# Patient Record
Sex: Female | Born: 1935 | Race: White | Hispanic: No | Smoking: Never smoker
Health system: Southern US, Community
[De-identification: ages and names within clinical notes are randomized; demographics above are authoritative.]

## PROBLEM LIST (undated history)

## (undated) DIAGNOSIS — I10 Essential (primary) hypertension: Secondary | ICD-10-CM

## (undated) DIAGNOSIS — F0390 Unspecified dementia without behavioral disturbance: Secondary | ICD-10-CM

## (undated) DIAGNOSIS — IMO0001 Reserved for inherently not codable concepts without codable children: Secondary | ICD-10-CM

## (undated) DIAGNOSIS — M109 Gout, unspecified: Secondary | ICD-10-CM

## (undated) DIAGNOSIS — R42 Dizziness and giddiness: Secondary | ICD-10-CM

## (undated) DIAGNOSIS — I4891 Unspecified atrial fibrillation: Secondary | ICD-10-CM

## (undated) DIAGNOSIS — C50919 Malignant neoplasm of unspecified site of unspecified female breast: Secondary | ICD-10-CM

## (undated) DIAGNOSIS — M1990 Unspecified osteoarthritis, unspecified site: Secondary | ICD-10-CM

## (undated) DIAGNOSIS — H9190 Unspecified hearing loss, unspecified ear: Secondary | ICD-10-CM

## (undated) DIAGNOSIS — F419 Anxiety disorder, unspecified: Secondary | ICD-10-CM

## (undated) HISTORY — PX: CHOLECYSTECTOMY: SHX55

---

## 2005-08-14 ENCOUNTER — Ambulatory Visit: Payer: Self-pay | Admitting: Internal Medicine

## 2006-08-22 ENCOUNTER — Ambulatory Visit: Payer: Self-pay | Admitting: Internal Medicine

## 2006-09-03 ENCOUNTER — Ambulatory Visit: Payer: Self-pay | Admitting: Internal Medicine

## 2007-02-25 ENCOUNTER — Ambulatory Visit: Payer: Self-pay | Admitting: Internal Medicine

## 2007-04-12 ENCOUNTER — Emergency Department: Payer: Self-pay

## 2007-09-07 ENCOUNTER — Ambulatory Visit: Payer: Self-pay | Admitting: Internal Medicine

## 2008-09-13 ENCOUNTER — Ambulatory Visit: Payer: Self-pay | Admitting: Internal Medicine

## 2009-10-06 ENCOUNTER — Ambulatory Visit: Payer: Self-pay | Admitting: Internal Medicine

## 2010-01-04 ENCOUNTER — Ambulatory Visit: Payer: Self-pay | Admitting: Unknown Physician Specialty

## 2010-12-12 ENCOUNTER — Ambulatory Visit: Payer: Self-pay | Admitting: Internal Medicine

## 2011-03-16 ENCOUNTER — Emergency Department: Payer: Self-pay | Admitting: Internal Medicine

## 2011-05-16 ENCOUNTER — Ambulatory Visit: Payer: Self-pay | Admitting: Internal Medicine

## 2011-11-01 ENCOUNTER — Emergency Department: Payer: Self-pay | Admitting: *Deleted

## 2011-12-03 HISTORY — PX: BREAST LUMPECTOMY: SHX2

## 2011-12-10 ENCOUNTER — Encounter: Payer: Self-pay | Admitting: Internal Medicine

## 2012-01-03 ENCOUNTER — Encounter: Payer: Self-pay | Admitting: Internal Medicine

## 2012-01-31 ENCOUNTER — Encounter: Payer: Self-pay | Admitting: Internal Medicine

## 2012-03-02 ENCOUNTER — Ambulatory Visit: Payer: Self-pay | Admitting: Internal Medicine

## 2012-03-03 ENCOUNTER — Ambulatory Visit: Payer: Self-pay | Admitting: Internal Medicine

## 2012-03-30 ENCOUNTER — Ambulatory Visit: Payer: Self-pay | Admitting: Surgery

## 2012-04-03 LAB — PATHOLOGY REPORT

## 2012-04-16 ENCOUNTER — Ambulatory Visit: Payer: Self-pay | Admitting: Surgery

## 2012-04-16 LAB — BASIC METABOLIC PANEL
Anion Gap: 7 (ref 7–16)
Chloride: 97 mmol/L — ABNORMAL LOW (ref 98–107)
EGFR (African American): 36 — ABNORMAL LOW
Glucose: 99 mg/dL (ref 65–99)
Osmolality: 267 (ref 275–301)
Sodium: 132 mmol/L — ABNORMAL LOW (ref 136–145)

## 2012-04-16 LAB — CBC WITH DIFFERENTIAL/PLATELET
Basophil #: 0.1 10*3/uL (ref 0.0–0.1)
Eosinophil %: 0.7 %
HCT: 34.1 % — ABNORMAL LOW (ref 35.0–47.0)
Lymphocyte #: 2 10*3/uL (ref 1.0–3.6)
Lymphocyte %: 20.6 %
MCH: 30.6 pg (ref 26.0–34.0)
MCHC: 32.8 g/dL (ref 32.0–36.0)
Monocyte #: 0.6 x10 3/mm (ref 0.2–0.9)
Monocyte %: 6.2 %
Neutrophil #: 7 10*3/uL — ABNORMAL HIGH (ref 1.4–6.5)
Neutrophil %: 71.9 %
Platelet: 205 10*3/uL (ref 150–440)
RDW: 14.4 % (ref 11.5–14.5)

## 2012-04-23 ENCOUNTER — Ambulatory Visit: Payer: Self-pay | Admitting: Surgery

## 2012-05-07 ENCOUNTER — Ambulatory Visit: Payer: Self-pay | Admitting: Oncology

## 2012-06-01 ENCOUNTER — Ambulatory Visit: Payer: Self-pay | Admitting: Oncology

## 2012-06-08 ENCOUNTER — Ambulatory Visit: Payer: Self-pay | Admitting: Surgery

## 2012-07-02 ENCOUNTER — Ambulatory Visit: Payer: Self-pay | Admitting: Oncology

## 2012-08-02 ENCOUNTER — Ambulatory Visit: Payer: Self-pay | Admitting: Oncology

## 2012-09-01 ENCOUNTER — Ambulatory Visit: Payer: Self-pay | Admitting: Oncology

## 2012-12-02 ENCOUNTER — Ambulatory Visit: Payer: Self-pay | Admitting: Oncology

## 2012-12-18 ENCOUNTER — Emergency Department: Payer: Self-pay | Admitting: Emergency Medicine

## 2012-12-18 LAB — URINALYSIS, COMPLETE
Bacteria: NONE SEEN
Blood: NEGATIVE
Ketone: NEGATIVE
Nitrite: NEGATIVE
Ph: 6 (ref 4.5–8.0)
Protein: 30
Specific Gravity: 1.016 (ref 1.003–1.030)
Squamous Epithelial: 2
WBC UR: 7 /HPF (ref 0–5)

## 2012-12-18 LAB — CBC
HGB: 12 g/dL (ref 12.0–16.0)
MCH: 31 pg (ref 26.0–34.0)
MCV: 93 fL (ref 80–100)
Platelet: 158 10*3/uL (ref 150–440)
RBC: 3.88 10*6/uL (ref 3.80–5.20)
RDW: 14.6 % — ABNORMAL HIGH (ref 11.5–14.5)
WBC: 10.7 10*3/uL (ref 3.6–11.0)

## 2012-12-18 LAB — COMPREHENSIVE METABOLIC PANEL
Albumin: 3.7 g/dL (ref 3.4–5.0)
Alkaline Phosphatase: 97 U/L (ref 50–136)
Anion Gap: 8 (ref 7–16)
BUN: 17 mg/dL (ref 7–18)
Bilirubin,Total: 0.5 mg/dL (ref 0.2–1.0)
Calcium, Total: 9.6 mg/dL (ref 8.5–10.1)
Chloride: 102 mmol/L (ref 98–107)
Co2: 29 mmol/L (ref 21–32)
Creatinine: 1.42 mg/dL — ABNORMAL HIGH (ref 0.60–1.30)
Glucose: 159 mg/dL — ABNORMAL HIGH (ref 65–99)
Osmolality: 282 (ref 275–301)
SGPT (ALT): 25 U/L (ref 12–78)
Sodium: 139 mmol/L (ref 136–145)
Total Protein: 7.3 g/dL (ref 6.4–8.2)

## 2012-12-18 LAB — CK TOTAL AND CKMB (NOT AT ARMC): CK-MB: 1.3 ng/mL (ref 0.5–3.6)

## 2013-01-02 ENCOUNTER — Ambulatory Visit: Payer: Self-pay | Admitting: Oncology

## 2013-02-16 ENCOUNTER — Ambulatory Visit: Payer: Self-pay | Admitting: Internal Medicine

## 2013-03-03 ENCOUNTER — Ambulatory Visit: Payer: Self-pay | Admitting: Radiation Oncology

## 2013-03-16 ENCOUNTER — Ambulatory Visit: Payer: Self-pay | Admitting: Oncology

## 2013-03-18 LAB — CBC CANCER CENTER
Basophil %: 0.6 %
Eosinophil %: 1.4 %
HCT: 32.4 % — ABNORMAL LOW (ref 35.0–47.0)
HGB: 10.4 g/dL — ABNORMAL LOW (ref 12.0–16.0)
MCH: 29 pg (ref 26.0–34.0)
MCV: 91 fL (ref 80–100)
Monocyte #: 0.5 x10 3/mm (ref 0.2–0.9)
Monocyte %: 6.1 %
Neutrophil #: 6 x10 3/mm (ref 1.4–6.5)
Neutrophil %: 72.2 %
Platelet: 186 x10 3/mm (ref 150–440)
RBC: 3.58 10*6/uL — ABNORMAL LOW (ref 3.80–5.20)

## 2013-03-18 LAB — COMPREHENSIVE METABOLIC PANEL
Albumin: 3.3 g/dL — ABNORMAL LOW (ref 3.4–5.0)
Alkaline Phosphatase: 109 U/L (ref 50–136)
Anion Gap: 5 — ABNORMAL LOW (ref 7–16)
BUN: 22 mg/dL — ABNORMAL HIGH (ref 7–18)
Calcium, Total: 9.6 mg/dL (ref 8.5–10.1)
Co2: 33 mmol/L — ABNORMAL HIGH (ref 21–32)
Creatinine: 1.53 mg/dL — ABNORMAL HIGH (ref 0.60–1.30)
EGFR (African American): 37 — ABNORMAL LOW
Osmolality: 283 (ref 275–301)
Potassium: 4.5 mmol/L (ref 3.5–5.1)
SGOT(AST): 26 U/L (ref 15–37)
Sodium: 139 mmol/L (ref 136–145)
Total Protein: 7 g/dL (ref 6.4–8.2)

## 2013-04-01 ENCOUNTER — Ambulatory Visit: Payer: Self-pay | Admitting: Oncology

## 2013-06-16 ENCOUNTER — Ambulatory Visit: Payer: Self-pay | Admitting: Oncology

## 2013-07-02 ENCOUNTER — Ambulatory Visit: Payer: Self-pay | Admitting: Oncology

## 2013-09-06 ENCOUNTER — Ambulatory Visit: Payer: Self-pay | Admitting: Oncology

## 2013-09-15 ENCOUNTER — Ambulatory Visit: Payer: Self-pay | Admitting: Oncology

## 2013-09-15 LAB — CBC CANCER CENTER
Basophil %: 0.4 %
Eosinophil #: 0.2 x10 3/mm (ref 0.0–0.7)
Eosinophil %: 2.5 %
HCT: 31.1 % — ABNORMAL LOW (ref 35.0–47.0)
HGB: 10.2 g/dL — ABNORMAL LOW (ref 12.0–16.0)
Lymphocyte %: 18.2 %
MCH: 30.2 pg (ref 26.0–34.0)
MCHC: 32.7 g/dL (ref 32.0–36.0)
MCV: 92 fL (ref 80–100)
Monocyte #: 0.7 x10 3/mm (ref 0.2–0.9)
Monocyte %: 9.1 %
Neutrophil #: 5.5 x10 3/mm (ref 1.4–6.5)
Neutrophil %: 69.8 %
RBC: 3.36 10*6/uL — ABNORMAL LOW (ref 3.80–5.20)
RDW: 14.6 % — ABNORMAL HIGH (ref 11.5–14.5)

## 2013-09-15 LAB — COMPREHENSIVE METABOLIC PANEL
Albumin: 3.4 g/dL (ref 3.4–5.0)
Anion Gap: 9 (ref 7–16)
BUN: 26 mg/dL — ABNORMAL HIGH (ref 7–18)
Calcium, Total: 9.2 mg/dL (ref 8.5–10.1)
Chloride: 98 mmol/L (ref 98–107)
Co2: 30 mmol/L (ref 21–32)
Creatinine: 1.67 mg/dL — ABNORMAL HIGH (ref 0.60–1.30)
EGFR (African American): 33 — ABNORMAL LOW
EGFR (Non-African Amer.): 28 — ABNORMAL LOW
Glucose: 129 mg/dL — ABNORMAL HIGH (ref 65–99)
Osmolality: 280 (ref 275–301)
SGOT(AST): 92 U/L — ABNORMAL HIGH (ref 15–37)
SGPT (ALT): 83 U/L — ABNORMAL HIGH (ref 12–78)
Total Protein: 6.9 g/dL (ref 6.4–8.2)

## 2013-09-16 LAB — CANCER ANTIGEN 27.29: CA 27.29: 33 U/mL (ref 0.0–38.6)

## 2013-10-02 ENCOUNTER — Ambulatory Visit: Payer: Self-pay | Admitting: Oncology

## 2014-03-08 DIAGNOSIS — M545 Low back pain, unspecified: Secondary | ICD-10-CM | POA: Insufficient documentation

## 2014-03-08 DIAGNOSIS — E785 Hyperlipidemia, unspecified: Secondary | ICD-10-CM | POA: Insufficient documentation

## 2014-03-08 DIAGNOSIS — I10 Essential (primary) hypertension: Secondary | ICD-10-CM | POA: Insufficient documentation

## 2014-03-08 DIAGNOSIS — E119 Type 2 diabetes mellitus without complications: Secondary | ICD-10-CM | POA: Insufficient documentation

## 2014-03-09 ENCOUNTER — Ambulatory Visit: Payer: Self-pay | Admitting: Oncology

## 2014-06-09 DIAGNOSIS — F0390 Unspecified dementia without behavioral disturbance: Secondary | ICD-10-CM | POA: Insufficient documentation

## 2014-06-16 ENCOUNTER — Ambulatory Visit: Payer: Self-pay | Admitting: Oncology

## 2014-09-19 ENCOUNTER — Ambulatory Visit: Payer: Self-pay | Admitting: Oncology

## 2014-09-19 LAB — COMPREHENSIVE METABOLIC PANEL
ALBUMIN: 3.8 g/dL (ref 3.4–5.0)
ALT: 20 U/L
ANION GAP: 7 (ref 7–16)
AST: 21 U/L (ref 15–37)
Alkaline Phosphatase: 160 U/L — ABNORMAL HIGH
BILIRUBIN TOTAL: 0.5 mg/dL (ref 0.2–1.0)
BUN: 14 mg/dL (ref 7–18)
CALCIUM: 9.5 mg/dL (ref 8.5–10.1)
CREATININE: 1.22 mg/dL (ref 0.60–1.30)
Chloride: 97 mmol/L — ABNORMAL LOW (ref 98–107)
Co2: 31 mmol/L (ref 21–32)
EGFR (African American): 54 — ABNORMAL LOW
GFR CALC NON AF AMER: 45 — AB
Glucose: 111 mg/dL — ABNORMAL HIGH (ref 65–99)
OSMOLALITY: 271 (ref 275–301)
Potassium: 4.1 mmol/L (ref 3.5–5.1)
Sodium: 135 mmol/L — ABNORMAL LOW (ref 136–145)
Total Protein: 7.3 g/dL (ref 6.4–8.2)

## 2014-09-19 LAB — CBC CANCER CENTER
BASOS ABS: 0 x10 3/mm (ref 0.0–0.1)
BASOS PCT: 0.5 %
EOS ABS: 0.1 x10 3/mm (ref 0.0–0.7)
EOS PCT: 0.8 %
HCT: 35.9 % (ref 35.0–47.0)
HGB: 11.1 g/dL — ABNORMAL LOW (ref 12.0–16.0)
LYMPHS ABS: 1.6 x10 3/mm (ref 1.0–3.6)
LYMPHS PCT: 21.9 %
MCH: 28.7 pg (ref 26.0–34.0)
MCHC: 31 g/dL — ABNORMAL LOW (ref 32.0–36.0)
MCV: 93 fL (ref 80–100)
MONO ABS: 0.5 x10 3/mm (ref 0.2–0.9)
Monocyte %: 7.3 %
Neutrophil #: 5.1 x10 3/mm (ref 1.4–6.5)
Neutrophil %: 69.5 %
PLATELETS: 170 x10 3/mm (ref 150–440)
RBC: 3.88 10*6/uL (ref 3.80–5.20)
RDW: 14.3 % (ref 11.5–14.5)
WBC: 7.4 x10 3/mm (ref 3.6–11.0)

## 2014-10-02 ENCOUNTER — Ambulatory Visit: Payer: Self-pay | Admitting: Oncology

## 2015-03-23 ENCOUNTER — Ambulatory Visit
Admit: 2015-03-23 | Disposition: A | Discharge status: Home / Self Care | Payer: Self-pay | Attending: Oncology | Admitting: Oncology

## 2015-03-26 NOTE — Consult Note (Signed)
Reason for Visit: This 81 year old Female patient presents to the clinic for initial evaluation of  Breast cancer .   Referred by Dr. Oliva Bustard.  Diagnosis:   Chief Complaint/Diagnosis   81 year old female status post wide local excision and sentinel node biopsy for a pathologic stage I (T1 A. N0 M0) ER/PR positive HER-2/neu negative invasive mammary carcinoma   Pathology Report Pathology report reviewed    Imaging Report Mammograms reviewed    Referral Report Clinical notes reviewed    Planned Treatment Regimen Possible accelerated partial breast irradiation versus whole breast radiation    HPI   patient is a pleasant 81 year old female who presents with an abnormal mammogram in the 12 o'clock position of the left breast. This was confirmed on ultrasound. Stereotactic biopsy was performed showing invasive mammary carcinoma no specific type.was ductal carcinoma in situ present. Patient on a wide local excision for a 2 mm invasive mammary carcinoma margins clear. One sentinel lymph node was negative. She tolerated her surgery well although did develop some bruising which is persistent. She has been evaluated by medical oncology will be placed on aromatase inhibitor after completion of radiation therapy. She otherwise is doing well.  Past Hx:    indigestion:    dementia:    gout:    a fib:    lumbar disc disease:    arthritis:    diet controlled diabetes:    hx of thyroid:    breast cancer:    perennial allergic rhinitis:    leg tremors:    dizziness:    htn:    macular degeneration:    Mastectomy:    back cyst removed:    Tonsillectomy:    Cholecystectomy:   Past, Family and Social History:   Past Medical History positive    Cardiovascular atrial fibrillation; hypertension    Gastrointestinal GERD    Endocrine diabetes mellitus    Neurological/Psychiatric Alzheimer's    Past Surgical History cholecystectomy; Tonsillectomy    Past Medical History  Comments Gallop, arthritis, leg tremors, macular degeneration, recurrent vertigo, obesity, lumbar disc disease    Family History positive    Family History Comments Sister with breast cancer former patient of mine also family history of hypertension    Social History noncontributory    Additional Past Medical and Surgical History Accompanied by his son and daughter today   Allergies:   PCN: Itching, Rash  Home Meds:  Home Medications: Medication Instructions Status  Caltrate 600 with D daily 1 tab(s) orally once a day (at bedtime) Active  norvasc  102m daily 1 tab(s) orally once a day at night Active  allopurinol 3034mdaily 1 tab(s) orally once a day in am Active  citalopram 2038m tabs  orally once a day in am Active  PreserVision 2 tabs   orally once a day in am Active  Vitamin B-100 oral tablet 1 tab(s) orally once a day in am Active  lisinopril 20 mg oral tablet 1 tab(s) orally 2 times a day Active   Review of Systems:   General negative    Performance Status (ECOG) 0    Skin negative    Breast see HPI    Ophthalmologic see HPI    ENMT negative    Respiratory and Thorax negative    Cardiovascular see HPI    Gastrointestinal see HPI    Genitourinary negative    Musculoskeletal negative    Neurological negative    Psychiatric see HPI    Endocrine negative  Allergic/Immunologic negative   Nursing Notes:  Nursing Vital Signs and Chemo Nursing Nursing Notes: *CC Vital Signs Flowsheet:   06-Jun-13 13:38   Temp Temperature 97.9   Pulse Pulse 111   Respirations Respirations 18   SBP SBP 152   DBP DBP 82   Pain Scale (0-10)  0   Current Weight (kg) (kg) 105   Height (cm) centimeters 163   BSA (m2) 2    14:45   Pain Scale (0-10)  0   Height (cm) centimeters 163   Physical Exam:  General/Skin/HEENT:   General normal    Skin normal    Eyes normal    ENMT normal    Head and Neck normal    Additional PE Well-developed elderly female in  NAD. Left breast has a lot wide local excision scar which is healing well. There some ecchymosis surrounding the incision site. Axillary incision is also healing well. No other dominant mass or nodularity is noted in either breast into position examined. Lungs are clear to A&P cardiac examination shows regular rate and rhythm. No axillary or supraclavicular adenopathy is appreciated.   Breasts/Resp/CV/GI/GU:   Respiratory and Thorax normal    Cardiovascular normal    Gastrointestinal normal    Genitourinary normal   MS/Neuro/Psych/Lymph:   Musculoskeletal normal    Neurological normal    Lymphatics normal   Assessment and Plan:  Impression:   stage I invasive mammary carcinoma of the left breast status post wide local excision and sentinel biopsy and 81 year old female  Plan:   at this time based on extreme small nature of her lesion as well as age believe she would be a good candidate for accelerated partial breast irradiation. Have discussed the case with Dr. Thompson Caul office. He will be performing ultrasound on Monday to determine if lumpectomy pocket is still present. If MammoSite catheter could not be placed we will alter with whole breast radiation. Risks and benefits of radiation were discussed with the patient and her family. Side effects including redness of the skin possible some slight lung radiation as well as fatigue were all discussed in depth with the patient and her family. We will make further determinations as to treatment option after her ultrasound on Monday.  I would like to take this opportunity to thank you for allowing me to continue to participate in this patient's care.  CC Referral:   cc: Dr. Tamala Julian, Dr. Arline Asp   Electronic Signatures: Baruch Gouty, Roda Shutters (MD)  (Signed 06-Jun-13 15:36)  Authored: HPI, Diagnosis, Past Hx, PFSH, Allergies, Home Meds, ROS, Nursing Notes, Physical Exam, Encounter Assessment and Plan, CC Referring Physician   Last Updated:  06-Jun-13 15:36 by Armstead Peaks (MD)

## 2015-03-26 NOTE — Op Note (Signed)
PATIENT NAME:  Marie Hicks, Marie Hicks MR#:  [id number] DATE OF BIRTH:  [birth year]  DATE OF PROCEDURE:  04/23/2012  PREOPERATIVE DIAGNOSIS: Carcinoma of the left breast.   POSTOPERATIVE DIAGNOSIS: Carcinoma of the left breast.   PROCEDURE: Left partial mastectomy with axillary sentinel lymph node biopsy.   SURGEON: Loreli Dollar, MD   ANESTHESIA: General.   INDICATIONS: This 81 year old female had recent mammogram depicting a mass in the upper aspect of the breast. She had a stereotactic biopsy which demonstrated invasive mammary carcinoma, no special type, and surgery was recommended for definitive treatment.   DESCRIPTION OF PROCEDURE: The patient was brought into the hospital to the Nuclear Medicine Department and had injection of radioactive technetium sulfur colloid which demonstrated a lymph node in the left axilla. She also had insertion of a Kopans wire with follow-up mammogram which I have reviewed the images demonstrating the biopsy marker site adjacent to the wire in the upper aspect of the left breast.   The patient was placed on the operating table in the supine position under general anesthesia. The left arm was placed on a lateral arm rest. The left breast, axilla, upper arm, and chest wall were prepared with ChloraPrep and draped in a sterile manner. The Kopans wire having been cut was in the approximately 11:30 position upper aspect of the left breast.   The axilla was probed with the gamma counter demonstrating the location of radioactivity. An approximately 4 cm incision was made in the inferior aspect of the axilla and carried down through subcutaneous tissues. One bleeding point was suture ligated with 4-0 chromic. Several small bleeding points were cauterized. Dissection was carried down through the superficial fascia down deep into the axilla using the gamma counter for direction. There was a lymph node adjacent to the rib cage which was radioactive and was removed along with a small  amount of surrounding fatty tissue. This dissection was largely carried out with electrocautery for hemostasis. The ex vivo count was greater than 800 counts per second. This was submitted for frozen section. The background count was negligible and the area was palpated. There was no grossly palpable lymph node. Hemostasis was intact.   Next, attention was turned to do the left partial mastectomy. A transversely oriented curvilinear incision was made both above and below the entrance point of the wire and the ellipse was approximately 1 cm wide. The incision extended from approximately the 11 o'clock to 1 o'clock position and was some 5 cm cephalad to the border of the areola. This was carried down through subcutaneous tissues and the dissection was made wider as it progressed down into the breast removing a sample of tissue which was approximately 5 x 5 x 5 cm in dimension and was labeled with the margin maps sutured to the specimen marking superior, inferior, medial, lateral, and deep margins. The 1 o'clock end of the skin ellipse was tagged with a nylon stitch. This was submitted for specimen mammogram and pathology. The specimen mammogram came up to the operating room and I could see the biopsy marker within the specimen. This was sent on to pathology.   The wound was inspected. Several small bleeding points were cauterized.   Pathologist called back initially to report that the sentinel lymph node appeared to be negative for metastasis and subsequently the axillary wound was further inspected. Hemostasis was intact. The wound was closed with a running 5-0 Monocryl subcuticular suture.   Next, the partial mastectomy wound was further inspected. Several small  bleeding points were cauterized. Hemostasis was intact. The subcutaneous tissues were infiltrated with 0.5% Sensorcaine with epinephrine. The subcutaneous tissues were closed with 4-0 chromic and then the wound was closed with a running 5-0 Monocryl  subcuticular suture. Both wounds were treated with Dermabond.   The patient was kept asleep and maintained a sterile field until the pathologist called back to say that the margins appeared to be good. Subsequently, the patient was prepared for transfer to the recovery room.   ____________________________ Lenna Sciara. Rochel Brome, MD [email or web address] D: 04/23/2012 13:28:21 ET T: 04/23/2012 15:53:17 ET JOB#: [id number]  cc: Loreli Dollar, MD, <Dictator> Loreli Dollar MD ELECTRONICALLY SIGNED 04/27/2012 11:14

## 2015-03-26 NOTE — Op Note (Signed)
PATIENT NAME:  Marie Hicks, Marie Hicks MR#:  [id number] DATE OF BIRTH:  09-03-1929  DATE OF PROCEDURE:  06/08/2012  PREOPERATIVE DIAGNOSIS: Carcinoma of the left breast.   POSTOPERATIVE DIAGNOSIS: Carcinoma of the left breast.  PROCEDURE: Insertion of MammoSite catheter in the left breast.   SURGEON: Rochel Brome, MD  ANESTHESIA: Local 0.5% Sensorcaine with epinephrine, monitored anesthesia care.   INDICATIONS: This 81 year old female recently had a left partial mastectomy for cancer, now needing insertion of this catheter for radiation brachytherapy.  DESCRIPTION OF PROCEDURE:  The patient was placed on the operating table in the supine position. The left arm was placed on a lateral arm rest. She was sedated. The left breast was prepared with ChloraPrep and draped in a sterile manner.   The scar from her previous partial mastectomy was in the upper aspect of the left breast. This was examined with ultrasound demonstrating a seroma which was approximately 2.5 x 2.5 x 3.5 cm in dimension. It appeared that the subcutaneous tissue between the skin and the seroma  was approximately 12 mm. Next, the skin lateral to the incision was infiltrated with 0.5% Sensorcaine with epinephrine. Using ultrasound guidance, a trocar was advanced into the seroma and the trocar was removed seeing serous fluid escape through the cannula. This was further expressed. The cannula was removed. The seroma was explored with Claiborne Billings clamps. Some additional seroma was evacuated. Next, the MammoSite catheter was inserted and inflated the balloon with 35 mL of water, watching the inflation with the ultrasound. By this point it appeared that the smallest measurement of subcutaneous tissue was approximately 7 mm and therefore did not inflate beyond 35 mL. Next, the catheter was attached to the skin with a 3-0 nylon suture. Dressings were applied using benzoin and Tegaderm. The metal stylette was exchanged for a plastic stylette and the catheter was  affixed to the skin with silk tape.     The patient tolerated the procedure satisfactorily and was then prepared for transfer to the recovery room.    ____________________________ Lenna Sciara. Rochel Brome, MD [email or web address]:bjt D: 06/08/2012 10:19:20 ET T: 06/08/2012 13:22:57 ET JOB#: [id number]  cc: Loreli Dollar, MD, <Dictator> Loreli Dollar MD ELECTRONICALLY SIGNED 06/12/2012 18:44

## 2015-06-22 ENCOUNTER — Encounter: Payer: Self-pay | Admitting: Radiation Oncology

## 2015-06-22 ENCOUNTER — Ambulatory Visit
Admission: RE | Admit: 2015-06-22 | Discharge: 2015-06-22 | Disposition: A | Discharge status: Home / Self Care | Payer: Medicare Other | Source: Ambulatory Visit | Attending: Radiation Oncology | Admitting: Radiation Oncology

## 2015-06-22 VITALS — BP 185/106 | HR 92 | Temp 97.3°F | Resp 20 | Wt 209.3 lb

## 2015-06-22 DIAGNOSIS — C50912 Malignant neoplasm of unspecified site of left female breast: Secondary | ICD-10-CM

## 2015-06-22 HISTORY — DX: Unspecified dementia, unspecified severity, without behavioral disturbance, psychotic disturbance, mood disturbance, and anxiety: F03.90

## 2015-06-22 HISTORY — DX: Gout, unspecified: M10.9

## 2015-06-22 HISTORY — DX: Unspecified osteoarthritis, unspecified site: M19.90

## 2015-06-22 HISTORY — DX: Unspecified atrial fibrillation: I48.91

## 2015-06-22 HISTORY — DX: Essential (primary) hypertension: I10

## 2015-06-22 HISTORY — DX: Malignant neoplasm of unspecified site of unspecified female breast: C50.919

## 2015-06-22 NOTE — Progress Notes (Signed)
Radiation Oncology Follow up Note  Name: Marie Hicks   Date:   06/22/2015 MRN:  [id number] DOB: October 05, 1929    This 81 y.o. female presents to the clinic today for follow-up for breast cancer stage I (T1 1 N0 M0) ER/PR positive HER-2/neu negative status post accelerated partial breast radiation 3 years prior  REFERRING PROVIDER: No ref. provider found  HPI: patient is a 81 year old female now out 3 years having completed accelerated partial breast radiation to the left breast for stage I (T1 N0 M0) ER/PR positive HER-2/neu negative invasive mammary carcinoma. Seen today in routine follow-up she is doing well. Seems to be slowly debilitating. She specifically denies breast tenderness cough or bone pain. Is currently on aromatase inhibitor and tolerated that well without side effect. Mammograms have been fine.  COMPLICATIONS OF TREATMENT: none  FOLLOW UP COMPLIANCE: keeps appointments   PHYSICAL EXAM:  BP 185/106 mmHg  Pulse 92  Temp(Src) 97.3 F (36.3 C)  Resp 20  Wt 209 lb 5.2 oz (94.95 kg) Well-developed elderly female in NADLungs are clear to A&P cardiac examination essentially unremarkable with regular rate and rhythm. No dominant mass or nodularity is noted in either breast in 2 positions examined. Incision is well-healed. No axillary or supraclavicular adenopathy is appreciated. Cosmetic result is excellent.she does have some residual glandular Caddick changes of the skin over the balloon catheter site although it is well-healed.  Well-developed well-nourished patient in NAD. HEENT reveals PERLA, EOMI, discs not visualized.  Oral cavity is clear. No oral mucosal lesions are identified. Neck is clear without evidence of cervical or supraclavicular adenopathy. Lungs are clear to A&P. Cardiac examination is essentially unremarkable with regular rate and rhythm without murmur rub or thrill. Abdomen is benign with no organomegaly or masses noted. Motor sensory and DTR levels are equal and  symmetric in the upper and lower extremities. Cranial nerves II through XII are grossly intact. Proprioception is intact. No peripheral adenopathy or edema is identified. No motor or sensory levels are noted. Crude visual fields are within normal range.   RADIOLOGY RESULTS: mammograms from April 2016 are reviewed. Shows some stable calcifications in the right breast with follow-up bilateral diagnostic mammogram suggested in one year. That film was reviewed  PLAN: present time she continues to do well. Based on her advanced age and overall general condition I'm to discontinue follow-up care at this point. She continues close follow-up care with medical oncology. Be happy to reevaluate the patient any time should further consultation be indicated.  I would like to take this opportunity for allowing me to participate in the care of your patient.Armstead Peaks., MD

## 2015-09-05 ENCOUNTER — Encounter: Payer: Self-pay | Admitting: *Deleted

## 2015-09-08 NOTE — Discharge Instructions (Signed)

## 2015-09-11 ENCOUNTER — Encounter: Payer: Self-pay | Admitting: Anesthesiology

## 2015-09-11 ENCOUNTER — Ambulatory Visit: Payer: Medicare Other | Admitting: Anesthesiology

## 2015-09-11 ENCOUNTER — Ambulatory Visit
Admission: RE | Admit: 2015-09-11 | Discharge: 2015-09-11 | Disposition: A | Discharge status: Home / Self Care | Payer: Medicare Other | Source: Ambulatory Visit | Attending: Ophthalmology | Admitting: Ophthalmology

## 2015-09-11 ENCOUNTER — Encounter
Admission: RE | Disposition: A | Discharge status: Home / Self Care | Payer: Self-pay | Source: Ambulatory Visit | Attending: Ophthalmology

## 2015-09-11 DIAGNOSIS — Z79899 Other long term (current) drug therapy: Secondary | ICD-10-CM | POA: Diagnosis not present

## 2015-09-11 DIAGNOSIS — F0280 Dementia in other diseases classified elsewhere without behavioral disturbance: Secondary | ICD-10-CM | POA: Insufficient documentation

## 2015-09-11 DIAGNOSIS — F419 Anxiety disorder, unspecified: Secondary | ICD-10-CM | POA: Diagnosis not present

## 2015-09-11 DIAGNOSIS — Z880 Allergy status to penicillin: Secondary | ICD-10-CM | POA: Insufficient documentation

## 2015-09-11 DIAGNOSIS — E119 Type 2 diabetes mellitus without complications: Secondary | ICD-10-CM | POA: Diagnosis not present

## 2015-09-11 DIAGNOSIS — M109 Gout, unspecified: Secondary | ICD-10-CM | POA: Insufficient documentation

## 2015-09-11 DIAGNOSIS — Z9049 Acquired absence of other specified parts of digestive tract: Secondary | ICD-10-CM | POA: Insufficient documentation

## 2015-09-11 DIAGNOSIS — M1990 Unspecified osteoarthritis, unspecified site: Secondary | ICD-10-CM | POA: Diagnosis not present

## 2015-09-11 DIAGNOSIS — I4891 Unspecified atrial fibrillation: Secondary | ICD-10-CM | POA: Diagnosis not present

## 2015-09-11 DIAGNOSIS — I10 Essential (primary) hypertension: Secondary | ICD-10-CM | POA: Diagnosis not present

## 2015-09-11 DIAGNOSIS — R011 Cardiac murmur, unspecified: Secondary | ICD-10-CM | POA: Diagnosis not present

## 2015-09-11 DIAGNOSIS — Z853 Personal history of malignant neoplasm of breast: Secondary | ICD-10-CM | POA: Insufficient documentation

## 2015-09-11 DIAGNOSIS — G309 Alzheimer's disease, unspecified: Secondary | ICD-10-CM | POA: Insufficient documentation

## 2015-09-11 DIAGNOSIS — H4089 Other specified glaucoma: Secondary | ICD-10-CM | POA: Diagnosis not present

## 2015-09-11 DIAGNOSIS — I499 Cardiac arrhythmia, unspecified: Secondary | ICD-10-CM | POA: Diagnosis not present

## 2015-09-11 DIAGNOSIS — Z888 Allergy status to other drugs, medicaments and biological substances status: Secondary | ICD-10-CM | POA: Diagnosis not present

## 2015-09-11 HISTORY — PX: PHOTOCOAGULATION WITH LASER: SHX6027

## 2015-09-11 HISTORY — DX: Reserved for inherently not codable concepts without codable children: IMO0001

## 2015-09-11 HISTORY — DX: Anxiety disorder, unspecified: F41.9

## 2015-09-11 HISTORY — DX: Unspecified hearing loss, unspecified ear: H91.90

## 2015-09-11 HISTORY — DX: Dizziness and giddiness: R42

## 2015-09-11 SURGERY — PHOTOCOAGULATION, EYE, USING LASER
Anesthesia: Monitor Anesthesia Care | Laterality: Right | Wound class: Clean Contaminated

## 2015-09-11 MED ORDER — OXYCODONE HCL 5 MG PO TABS: 5.0000 mg | ORAL_TABLET | Freq: Once | ORAL | Status: DC | PRN

## 2015-09-11 MED ORDER — LABETALOL HCL 5 MG/ML IV SOLN
5.0000 mg | INTRAVENOUS | Status: DC | PRN
Start: 2015-09-11 — End: 2015-09-11
  Administered 2015-09-11: 5 mg via INTRAVENOUS

## 2015-09-11 MED ORDER — MIDAZOLAM HCL 2 MG/2ML IJ SOLN
INTRAMUSCULAR | Status: DC | PRN
  Administered 2015-09-11: 1 mg via INTRAVENOUS

## 2015-09-11 MED ORDER — LACTATED RINGERS IV SOLN: INTRAVENOUS | Status: DC

## 2015-09-11 MED ORDER — ALFENTANIL 500 MCG/ML IJ INJ
INJECTION | INTRAMUSCULAR | Status: DC | PRN
  Administered 2015-09-11: 300 ug via INTRAVENOUS
  Administered 2015-09-11: 100 ug via INTRAVENOUS

## 2015-09-11 MED ORDER — NEOMYCIN-POLYMYXIN-DEXAMETH 3.5-10000-0.1 OP OINT
TOPICAL_OINTMENT | OPHTHALMIC | Status: DC | PRN
  Administered 2015-09-11: 1 via OPHTHALMIC

## 2015-09-11 MED ORDER — OXYCODONE HCL 5 MG/5ML PO SOLN: 5.0000 mg | Freq: Once | ORAL | Status: DC | PRN

## 2015-09-11 MED ORDER — LIDOCAINE HCL 2 % IJ SOLN
INTRAMUSCULAR | Status: DC | PRN
  Administered 2015-09-11: 3 mL via OPHTHALMIC

## 2015-09-11 SURGICAL SUPPLY — 13 items
BANDAGE EYE OVAL (MISCELLANEOUS) ×6 IMPLANT
DEVICE G-PROBE SGL USE (Laser) ×1 IMPLANT
DEVICE MICRO PULS P3 SGL USE (Laser) IMPLANT
G-PROBE SGL USE (Laser) ×3
GAUZE SPONGE 4X4 12PLY STRL (GAUZE/BANDAGES/DRESSINGS) ×3 IMPLANT
NDL RETROBULBAR .5 NSTRL (NEEDLE) ×3 IMPLANT
NEEDLE FILTER BLUNT 18X 1/2SAF (NEEDLE) ×2
NEEDLE FILTER BLUNT 18X1 1/2 (NEEDLE) ×1 IMPLANT
NEEDLE HYPO 30GX1 BEV (NEEDLE) IMPLANT
SYR TB 1ML LUER SLIP (SYRINGE) IMPLANT
SYRINGE 10CC LL (SYRINGE) ×3 IMPLANT
WATER STERILE IRR 250ML POUR (IV SOLUTION) ×3 IMPLANT
WATER STERILE IRR 500ML POUR (IV SOLUTION) IMPLANT

## 2015-09-11 NOTE — Anesthesia Procedure Notes (Signed)
Procedure Name: MAC Performed by: Jasiyah Paulding Pre-anesthesia Checklist: Patient identified, Emergency Drugs available, Suction available, Timeout performed and Patient being monitored Patient Re-evaluated:Patient Re-evaluated prior to inductionOxygen Delivery Method: Nasal cannula Placement Confirmation: positive ETCO2       

## 2015-09-11 NOTE — Addendum Note (Signed)
Addendum  created 09/11/15 1253 by Fidel Levy, MD   Modules edited: Notes Section   Notes Section:  File: [id number]

## 2015-09-11 NOTE — Anesthesia Postprocedure Evaluation (Addendum)
  Anesthesia Post-op Note  Patient: Marie Hicks  Procedure(s) Performed: Procedure(s) with comments: PHOTOCOAGULATION WITH LASER (Right) - NEEDS REGULAR LASER PROBE NOT MICROPULSE  Anesthesia type:MAC  Patient location: PACU  Post pain: Pain level controlled  Post assessment: Post-op Vital signs reviewed, Patient's Cardiovascular Status Stable, Respiratory Function Stable, Patent Airway and No signs of Nausea or vomiting  Post vital signs: Reviewed and stable  Last Vitals:  Filed Vitals:   09/11/15 1215  BP: 161/90  Pulse: 68  Temp:   Resp: 16    Level of consciousness: awake, alert  and patient cooperative  Complications: No apparent anesthesia complications  HTN in PACU treated with IV Labetalol.   Pt to resume home BP meds.

## 2015-09-11 NOTE — H&P (Signed)
H+P reviewed and is up to date, please see paper chart.  

## 2015-09-11 NOTE — Transfer of Care (Signed)
Immediate Anesthesia Transfer of Care Note  Patient: Marie Hicks  Procedure(s) Performed: Procedure(s) with comments: PHOTOCOAGULATION WITH LASER (Right) - NEEDS REGULAR LASER PROBE NOT MICROPULSE  Patient Location: PACU  Anesthesia Type: MAC  Level of Consciousness: awake, alert  and patient cooperative  Airway and Oxygen Therapy: Patient Spontanous Breathing and Patient connected to supplemental oxygen  Post-op Assessment: Post-op Vital signs reviewed, Patient's Cardiovascular Status Stable, Respiratory Function Stable, Patent Airway and No signs of Nausea or vomiting  Post-op Vital Signs: Reviewed and stable  Complications: No apparent anesthesia complications

## 2015-09-11 NOTE — Anesthesia Preprocedure Evaluation (Signed)
Anesthesia Evaluation  Patient identified by MRN, date of birth, ID band  Reviewed: NPO status   History of Anesthesia Complications Negative for: history of anesthetic complications  Airway Mallampati: II  TM Distance: >3 FB Neck ROM: full    Dental  (+) Missing,    Pulmonary neg pulmonary ROS,    Pulmonary exam normal        Cardiovascular Exercise Tolerance: Good hypertension, Normal cardiovascular exam+ dysrhythmias Atrial Fibrillation      Neuro/Psych Moderate Dementia, Alzheimers;  A&Ox1;  negative psych ROS   GI/Hepatic negative GI ROS, Neg liver ROS,   Endo/Other  negative endocrine ROS  Renal/GU negative Renal ROS  negative genitourinary   Musculoskeletal  (+) Arthritis  (gout),   Abdominal   Peds  Hematology L breast cancer > L mastectomy > no IV/BP on Left   Anesthesia Other Findings Cards stable: 05/2014: dr. Ubaldo Glassing;   Reproductive/Obstetrics                             Anesthesia Physical Anesthesia Plan  ASA: III  Anesthesia Plan: MAC   Post-op Pain Management:    Induction:   Airway Management Planned:   Additional Equipment:   Intra-op Plan:   Post-operative Plan:   Informed Consent: I have reviewed the patients History and Physical, chart, labs and discussed the procedure including the risks, benefits and alternatives for the proposed anesthesia with the patient or authorized representative who has indicated his/her understanding and acceptance.     Plan Discussed with: CRNA  Anesthesia Plan Comments:         Anesthesia Quick Evaluation

## 2015-09-11 NOTE — Addendum Note (Signed)
Addendum  created 09/11/15 1233 by Fidel Levy, MD   Modules edited: Orders

## 2015-09-11 NOTE — Op Note (Signed)
DATE OF SURGERY: 09/11/2015  PREOPERATIVE DIAGNOSES: Severe stage neovascular glaucoma, right eye  POSTOPERATIVE DIAGNOSES: Same  PROCEDURES PERFORMED: Transscleral diode cyclophotocoagulation, right eye  SURGEON: Almon Hercules, M.D.  ANESTHESIA: MAC, retrobulbar  COMPLICATIONS: None.  INDICATIONS FOR PROCEDURE: Marie Hicks is a 81 y.o. year-old female with uncontrolled neovascular glaucoma. The risks and benefits of glaucoma surgery were discussed with the patient, and she consented for a diode laser surgery.  PROCEDURE IN DETAIL: The eye for surgery was verified during the time-out procedure in the operating room. A retrobulbar block of lidocaine, Marcaine, and hyaluronidase was done for anesthesia. A G-probe was applied to the limbus in a 360 degree fashion with the following settings: 2087mW, 2000 milliseconds for a total of 22 applications.  The eye was pressure patched closed after Maxitrol ointment was applied to the eye. The patient tolerated the procedure well and was transferred to the Post-operative Care Unit in stable condition.

## 2015-09-12 ENCOUNTER — Encounter: Payer: Self-pay | Admitting: Ophthalmology

## 2015-09-15 ENCOUNTER — Other Ambulatory Visit: Payer: Self-pay | Admitting: *Deleted

## 2015-09-15 DIAGNOSIS — C50919 Malignant neoplasm of unspecified site of unspecified female breast: Secondary | ICD-10-CM

## 2015-09-20 ENCOUNTER — Inpatient Hospital Stay: Payer: Medicare Other | Attending: Oncology

## 2015-09-20 ENCOUNTER — Encounter: Payer: Self-pay | Admitting: Oncology

## 2015-09-20 ENCOUNTER — Inpatient Hospital Stay (HOSPITAL_BASED_OUTPATIENT_CLINIC_OR_DEPARTMENT_OTHER): Payer: Medicare Other | Admitting: Oncology

## 2015-09-20 VITALS — BP 149/90 | HR 68 | Temp 96.8°F | Wt 198.0 lb

## 2015-09-20 DIAGNOSIS — M109 Gout, unspecified: Secondary | ICD-10-CM | POA: Diagnosis not present

## 2015-09-20 DIAGNOSIS — I10 Essential (primary) hypertension: Secondary | ICD-10-CM | POA: Diagnosis not present

## 2015-09-20 DIAGNOSIS — Z79811 Long term (current) use of aromatase inhibitors: Secondary | ICD-10-CM

## 2015-09-20 DIAGNOSIS — C50912 Malignant neoplasm of unspecified site of left female breast: Secondary | ICD-10-CM

## 2015-09-20 DIAGNOSIS — I4891 Unspecified atrial fibrillation: Secondary | ICD-10-CM | POA: Diagnosis not present

## 2015-09-20 DIAGNOSIS — R42 Dizziness and giddiness: Secondary | ICD-10-CM | POA: Insufficient documentation

## 2015-09-20 DIAGNOSIS — F0390 Unspecified dementia without behavioral disturbance: Secondary | ICD-10-CM | POA: Diagnosis not present

## 2015-09-20 DIAGNOSIS — Z170 Estrogen receptor positive status [ER+]: Secondary | ICD-10-CM | POA: Insufficient documentation

## 2015-09-20 DIAGNOSIS — Z79899 Other long term (current) drug therapy: Secondary | ICD-10-CM | POA: Diagnosis not present

## 2015-09-20 DIAGNOSIS — F419 Anxiety disorder, unspecified: Secondary | ICD-10-CM | POA: Insufficient documentation

## 2015-09-20 DIAGNOSIS — C50919 Malignant neoplasm of unspecified site of unspecified female breast: Secondary | ICD-10-CM

## 2015-09-20 LAB — CBC WITH DIFFERENTIAL/PLATELET
BASOS PCT: 0 %
Basophils Absolute: 0 10*3/uL (ref 0–0.1)
EOS ABS: 0.1 10*3/uL (ref 0–0.7)
Eosinophils Relative: 1 %
HEMATOCRIT: 38.2 % (ref 35.0–47.0)
HEMOGLOBIN: 12.2 g/dL (ref 12.0–16.0)
LYMPHS ABS: 1.6 10*3/uL (ref 1.0–3.6)
Lymphocytes Relative: 20 %
MCH: 29.2 pg (ref 26.0–34.0)
MCHC: 32 g/dL (ref 32.0–36.0)
MCV: 91.4 fL (ref 80.0–100.0)
MONO ABS: 0.6 10*3/uL (ref 0.2–0.9)
MONOS PCT: 7 %
NEUTROS ABS: 5.9 10*3/uL (ref 1.4–6.5)
Neutrophils Relative %: 72 %
Platelets: 146 10*3/uL — ABNORMAL LOW (ref 150–440)
RBC: 4.18 MIL/uL (ref 3.80–5.20)
RDW: 14.8 % — AB (ref 11.5–14.5)
WBC: 8.2 10*3/uL (ref 3.6–11.0)

## 2015-09-20 LAB — COMPREHENSIVE METABOLIC PANEL
ALBUMIN: 4.3 g/dL (ref 3.5–5.0)
ALK PHOS: 71 U/L (ref 38–126)
ALT: 17 U/L (ref 14–54)
ANION GAP: 7 (ref 5–15)
AST: 29 U/L (ref 15–41)
BILIRUBIN TOTAL: 0.9 mg/dL (ref 0.3–1.2)
BUN: 27 mg/dL — ABNORMAL HIGH (ref 6–20)
CALCIUM: 10.3 mg/dL (ref 8.9–10.3)
CO2: 31 mmol/L (ref 22–32)
Chloride: 96 mmol/L — ABNORMAL LOW (ref 101–111)
Creatinine, Ser: 1.41 mg/dL — ABNORMAL HIGH (ref 0.44–1.00)
GFR calc non Af Amer: 33 mL/min — ABNORMAL LOW (ref >60)
GFR, EST AFRICAN AMERICAN: 38 mL/min — AB (ref >60)
GLUCOSE: 123 mg/dL — AB (ref 65–99)
POTASSIUM: 3.8 mmol/L (ref 3.5–5.1)
Sodium: 134 mmol/L — ABNORMAL LOW (ref 135–145)
TOTAL PROTEIN: 7.4 g/dL (ref 6.5–8.1)

## 2015-09-20 MED ORDER — LETROZOLE 2.5 MG PO TABS: 2.5000 mg | ORAL_TABLET | Freq: Every day | ORAL | Status: AC

## 2015-09-20 NOTE — Progress Notes (Signed)
[place] [facility] [phone]  [phone]     SIAN ROCKERS OB: September 14, 1929  MR#: [id number]  YKD#:[id number]  Patient Care Team: Derinda Late, MD as PCP - General (Family Medicine)  CHIEF COMPLAINT:  Chief Complaint  Patient presents with  . Breast Cancer   Chief Complaint/Diagnosis:   85. 81 year old female status post wide local excision and sentinel node biopsy for a pathologic stage I (T1 A. N0 M0) ER/PR positive, HER-2/neu negative invasive mammary carcinoma. 2. Patient has finished partial breast radiation therapy. 3. She had local reaction in the breast which is now healed. 81 year old female status post wide local excision and sentinel node biopsy for a pathologic stage I (T1 A. N0 M0) ER/PR positive HER-2/neu negative invasive mammary carcinoma diagnosis on March 30, 2012  HPI:      INTERVAL HISTORY:  81 year old lady is taking letrozole.  No bony pains.  Appetite has been fairly good getting regular mammogram done has a history of stage I carcinoma breast   REVIEW OF SYSTEMS:   GENERAL:  Feels good.  Active.  No fevers, sweats or weight loss. PERFORMANCE STATUS (ECOG):1 HEENT:  No visual changes, runny nose, sore throat, mouth sores or tenderness. Lungs: No shortness of breath or cough.  No hemoptysis. Cardiac:  No chest pain, palpitations, orthopnea, or PND. GI:  No nausea, vomiting, diarrhea, constipation, melena or hematochezia. GU:  No urgency, frequency, dysuria, or hematuria. Musculoskeletal:  No back pain.  No joint pain.  No muscle tenderness. Extremities:  No pain or swelling. Skin:  No rashes or skin changes. Neuro:  No headache, numbness or weakness, balance or coordination issues. Endocrine:  No diabetes, thyroid issues, hot flashes or night sweats. Psych:  No mood changes, depression or anxiety. Pain:  No focal pain. Review of systems:  All other systems reviewed and found to be negative. As per HPI. Otherwise, a complete  review of systems is negatve.  PAST MEDICAL HISTORY: Past Medical History  Diagnosis Date  . Breast cancer ([place])   . Gout   . A-fib ([place])   . Arthritis   . Hypertension   . Shortness of breath dyspnea     with exertion  . Dementia     son and daughter are POA  . Anxiety   . HOH (hard of hearing)   . Vertigo     past hx    PAST SURGICAL HISTORY: Past Surgical History  Procedure Laterality Date  . Mastectomy    . Cholecystectomy    . Photocoagulation with laser Right 09/11/2015    Procedure: PHOTOCOAGULATION WITH LASER;  Surgeon: Ronnell Freshwater, MD;  Location: [facility];  Service: Ophthalmology;  Laterality: Right;  NEEDS REGULAR LASER PROBE NOT MICROPULSE    Significant History/PMH:   indigestion:    dementia:    gout:    a fib:    lumbar disc disease:    arthritis:    diet controlled diabetes:    hx of thyroid:    breast cancer:    perennial allergic rhinitis:    leg tremors:    dizziness:    htn:    macular degeneration:    Mastectomy: Left Partial Mastectomy   back cyst removed:    Tonsillectomy:    Cholecystectomy:   Preventive Screening:  Has patient had any of the following test? Mammography   Last Mammography: April 2015   Smoking History: Smoking History Never Smoked.  PFSH: Comments: Positive for breast cancer in the sister.  Another sister had a colon cancer.  Social History: negative alcohol, negative tobacco  Comments: Resident of [facility], assisted living.  Additional Past Medical and Surgical History: As described above    ADVANCED DIRECTIVES:   Patient does have advance healthcare directive, Patient   does not desire to make any changes HEALTH MAINTENANCE: Social History  Substance Use Topics  . Smoking status: Never Smoker   . Smokeless tobacco: Never Used  . Alcohol Use: No      Allergies  Allergen Reactions  . Aliskiren Other (See Comments)    cramps Other reaction(s): Other (See  Comments) cramps  . Donepezil Nausea Only  . Donepezil Hcl Nausea Only  . Exelon [Rivastigmine] Rash  . Penicillin G Potassium In D5w Rash    Current Outpatient Prescriptions  Medication Sig Dispense Refill  . allopurinol (ZYLOPRIM) 300 MG tablet Take by mouth.    Marland Kitchen atropine 1 % ophthalmic solution     . Calcium-Vitamin D-Vitamin K [id number] MG-UNT-MCG CHEW Chew by mouth.    . citalopram (CELEXA) 10 MG tablet Take by mouth.    . DUREZOL 0.05 % EMUL     . letrozole (FEMARA) 2.5 MG tablet Take by mouth.    Marland Kitchen lisinopril (PRINIVIL,ZESTRIL) 10 MG tablet Take by mouth.    Marland Kitchen LORazepam (ATIVAN) 0.5 MG tablet Take by mouth.    . LUMIGAN 0.01 % SOLN     . Multiple Vitamin (MULTI-VITAMINS) TABS Take by mouth.    . risperiDONE (RISPERDAL) 0.5 MG tablet Take by mouth.    . traZODone (DESYREL) 50 MG tablet Take 100 mg by mouth.     . triamcinolone cream (KENALOG) 0.1 %      No current facility-administered medications for this visit.    OBJECTIVE:  Filed Vitals:   09/20/15 1526  BP: 149/90  Pulse: 68  Temp: 96.8 F (36 C)     Body mass index is 32.94 kg/(m^2).    ECOG FS:1 - Symptomatic but completely ambulatory  PHYSICAL EXAM: GENERAL:  Well developed, well nourished, sitting comfortably in the exam room in no acute distress. MENTAL STATUS:  Alert and oriented to person, place and time. . ENT:  Oropharynx clear without lesion.  Tongue normal. Mucous membranes moist.  RESPIRATORY:  Clear to auscultation without rales, wheezes or rhonchi. CARDIOVASCULAR:  Regular rate and rhythm without murmur, rub or gallop. BREAST:  Right breast without masses, skin changes or nipple discharge.  Left breast without masses, skin changes or nipple discharge. ABDOMEN:  Soft, non-tender, with active bowel sounds, and no hepatosplenomegaly.  No masses. BACK:  No CVA tenderness.  No tenderness on percussion of the back or rib cage. SKIN:  No rashes, ulcers or lesions. EXTREMITIES: No edema, no skin  discoloration or tenderness.  No palpable cords. LYMPH NODES: No palpable cervical, supraclavicular, axillary or inguinal adenopathy  NEUROLOGICAL: Unremarkable. PSYCH:  Appropriate.   LAB RESULTS:  CBC Latest Ref Rng 09/20/2015 09/19/2014  WBC 3.6 - 11.0 K/uL 8.2 7.4  Hemoglobin 12.0 - 16.0 g/dL 12.2 11.1(L)  Hematocrit 35.0 - 47.0 % 38.2 35.9  Platelets 150 - 440 K/uL 146(L) 170    Appointment on 09/20/2015  Component Date Value Ref Range Status  . WBC 09/20/2015 8.2  3.6 - 11.0 K/uL Final  . RBC 09/20/2015 4.18  3.80 - 5.20 MIL/uL Final  . Hemoglobin 09/20/2015 12.2  12.0 - 16.0 g/dL Final  . HCT 09/20/2015 38.2  35.0 - 47.0 % Final  . MCV 09/20/2015 91.4  80.0 - 100.0 fL Final  . MCH 09/20/2015 29.2  26.0 - 34.0 pg Final  . MCHC 09/20/2015 32.0  32.0 - 36.0 g/dL Final  . RDW 09/20/2015 14.8* 11.5 - 14.5 % Final  . Platelets 09/20/2015 146* 150 - 440 K/uL Final  . Neutrophils Relative % 09/20/2015 72   Final  . Neutro Abs 09/20/2015 5.9  1.4 - 6.5 K/uL Final  . Lymphocytes Relative 09/20/2015 20   Final  . Lymphs Abs 09/20/2015 1.6  1.0 - 3.6 K/uL Final  . Monocytes Relative 09/20/2015 7   Final  . Monocytes Absolute 09/20/2015 0.6  0.2 - 0.9 K/uL Final  . Eosinophils Relative 09/20/2015 1   Final  . Eosinophils Absolute 09/20/2015 0.1  0 - 0.7 K/uL Final  . Basophils Relative 09/20/2015 0   Final  . Basophils Absolute 09/20/2015 0.0  0 - 0.1 K/uL Final  . Sodium 09/20/2015 134* 135 - 145 mmol/L Final  . Potassium 09/20/2015 3.8  3.5 - 5.1 mmol/L Final  . Chloride 09/20/2015 96* 101 - 111 mmol/L Final  . CO2 09/20/2015 31  22 - 32 mmol/L Final  . Glucose, Bld 09/20/2015 123* 65 - 99 mg/dL Final  . BUN 09/20/2015 27* 6 - 20 mg/dL Final  . Creatinine, Ser 09/20/2015 1.41* 0.44 - 1.00 mg/dL Final  . Calcium 09/20/2015 10.3  8.9 - 10.3 mg/dL Final  . Total Protein 09/20/2015 7.4  6.5 - 8.1 g/dL Final  . Albumin 09/20/2015 4.3  3.5 - 5.0 g/dL Final  . AST 09/20/2015 29  15  - 41 U/L Final  . ALT 09/20/2015 17  14 - 54 U/L Final  . Alkaline Phosphatase 09/20/2015 71  38 - 126 U/L Final  . Total Bilirubin 09/20/2015 0.9  0.3 - 1.2 mg/dL Final  . GFR calc non Af Amer 09/20/2015 33* >60 mL/min Final  . GFR calc Af Amer 09/20/2015 38* >60 mL/min Final   Comment: (NOTE) The eGFR has been calculated using the CKD EPI equation. This calculation has not been validated in all clinical situations. eGFR's persistently <60 mL/min signify possible Chronic Kidney Disease.   . Anion gap 09/20/2015 7  5 - 15 Final      STUDIES: IMPRESSION: No new evidence for malignancy in either breast. Stable probably benign right breast calcifications. Followup bilateral diagnostic mammography with spot magnification views is recommended in 1 year.  RECOMMENDATION: Diagnostic mammogram is suggested in 1 year. (Code:DM-B-01Y)  I have discussed the findings and recommendations with the patient. Results were also provided in writing at the conclusion of the visit. If applicable, a reminder letter will be sent to the patient regarding the next appointment.  BI-RADS CATEGORY 3: Probably benign.   Electronically Signed  By: Conchita Paris M.D.  On: 03/23/2015 14:48 ASSESSMENT: Carcinoma breast stage I disease mammogram in April of 2016 was reported to be negative Continue letrozole  MEDICAL DECISION MAKING:  No clinical evidence of recurrent disease mammogram is been reviewed independently Repeat mammogram in next April Bone density study for needed patient was advised to continue calcium and vitamin D  Patient expressed understanding and was in agreement with this plan. She also understands that She can call clinic at any time with any questions, concerns, or complaints.    No matching staging information was found for the patient.  Forest Gleason, MD   09/20/2015 3:49 PM

## 2015-09-21 LAB — CANCER ANTIGEN 27.29: CA 27.29: 34.3 U/mL (ref 0.0–38.6)

## 2015-09-24 ENCOUNTER — Encounter: Payer: Self-pay | Admitting: Oncology

## 2015-10-02 ENCOUNTER — Emergency Department: Payer: Medicare Other

## 2015-10-02 ENCOUNTER — Emergency Department
Admission: EM | Admit: 2015-10-02 | Discharge: 2015-10-02 | Disposition: A | Discharge status: Home / Self Care | Payer: Medicare Other | Attending: Emergency Medicine | Admitting: Emergency Medicine

## 2015-10-02 ENCOUNTER — Encounter: Payer: Self-pay | Admitting: *Deleted

## 2015-10-02 DIAGNOSIS — Y9301 Activity, walking, marching and hiking: Secondary | ICD-10-CM | POA: Insufficient documentation

## 2015-10-02 DIAGNOSIS — Z79899 Other long term (current) drug therapy: Secondary | ICD-10-CM | POA: Insufficient documentation

## 2015-10-02 DIAGNOSIS — S79911A Unspecified injury of right hip, initial encounter: Secondary | ICD-10-CM | POA: Diagnosis not present

## 2015-10-02 DIAGNOSIS — S79912A Unspecified injury of left hip, initial encounter: Secondary | ICD-10-CM | POA: Diagnosis not present

## 2015-10-02 DIAGNOSIS — I10 Essential (primary) hypertension: Secondary | ICD-10-CM | POA: Diagnosis not present

## 2015-10-02 DIAGNOSIS — Z880 Allergy status to penicillin: Secondary | ICD-10-CM | POA: Diagnosis not present

## 2015-10-02 DIAGNOSIS — W010XXA Fall on same level from slipping, tripping and stumbling without subsequent striking against object, initial encounter: Secondary | ICD-10-CM | POA: Insufficient documentation

## 2015-10-02 DIAGNOSIS — E119 Type 2 diabetes mellitus without complications: Secondary | ICD-10-CM | POA: Insufficient documentation

## 2015-10-02 DIAGNOSIS — B354 Tinea corporis: Secondary | ICD-10-CM | POA: Diagnosis not present

## 2015-10-02 DIAGNOSIS — R2243 Localized swelling, mass and lump, lower limb, bilateral: Secondary | ICD-10-CM | POA: Diagnosis not present

## 2015-10-02 DIAGNOSIS — Y998 Other external cause status: Secondary | ICD-10-CM | POA: Diagnosis not present

## 2015-10-02 DIAGNOSIS — Y92129 Unspecified place in nursing home as the place of occurrence of the external cause: Secondary | ICD-10-CM | POA: Diagnosis not present

## 2015-10-02 DIAGNOSIS — S3992XA Unspecified injury of lower back, initial encounter: Secondary | ICD-10-CM | POA: Diagnosis present

## 2015-10-02 DIAGNOSIS — W19XXXA Unspecified fall, initial encounter: Secondary | ICD-10-CM

## 2015-10-02 LAB — CBC WITH DIFFERENTIAL/PLATELET
BASOS ABS: 0 10*3/uL (ref 0–0.1)
BASOS PCT: 0 %
EOS ABS: 0.1 10*3/uL (ref 0–0.7)
Eosinophils Relative: 2 %
HCT: 35.5 % (ref 35.0–47.0)
HEMOGLOBIN: 11.3 g/dL — AB (ref 12.0–16.0)
Lymphocytes Relative: 18 %
Lymphs Abs: 1.3 10*3/uL (ref 1.0–3.6)
MCH: 29.2 pg (ref 26.0–34.0)
MCHC: 32 g/dL (ref 32.0–36.0)
MCV: 91.5 fL (ref 80.0–100.0)
MONOS PCT: 7 %
Monocytes Absolute: 0.5 10*3/uL (ref 0.2–0.9)
NEUTROS ABS: 5.4 10*3/uL (ref 1.4–6.5)
NEUTROS PCT: 73 %
Platelets: 143 10*3/uL — ABNORMAL LOW (ref 150–440)
RBC: 3.88 MIL/uL (ref 3.80–5.20)
RDW: 15.4 % — ABNORMAL HIGH (ref 11.5–14.5)
WBC: 7.3 10*3/uL (ref 3.6–11.0)

## 2015-10-02 LAB — URINALYSIS COMPLETE WITH MICROSCOPIC (ARMC ONLY)
BILIRUBIN URINE: NEGATIVE
Bacteria, UA: NONE SEEN
Glucose, UA: NEGATIVE mg/dL
Hgb urine dipstick: NEGATIVE
KETONES UR: NEGATIVE mg/dL
Leukocytes, UA: NEGATIVE
NITRITE: NEGATIVE
Protein, ur: NEGATIVE mg/dL
RBC / HPF: NONE SEEN RBC/hpf (ref 0–5)
Specific Gravity, Urine: 1.005 (ref 1.005–1.030)
pH: 8 (ref 5.0–8.0)

## 2015-10-02 LAB — BASIC METABOLIC PANEL
ANION GAP: 9 (ref 5–15)
BUN: 20 mg/dL (ref 6–20)
CALCIUM: 10.4 mg/dL — AB (ref 8.9–10.3)
CHLORIDE: 98 mmol/L — AB (ref 101–111)
CO2: 31 mmol/L (ref 22–32)
CREATININE: 1.24 mg/dL — AB (ref 0.44–1.00)
GFR calc non Af Amer: 39 mL/min — ABNORMAL LOW (ref >60)
GFR, EST AFRICAN AMERICAN: 45 mL/min — AB (ref >60)
Glucose, Bld: 111 mg/dL — ABNORMAL HIGH (ref 65–99)
Potassium: 3.9 mmol/L (ref 3.5–5.1)
SODIUM: 138 mmol/L (ref 135–145)

## 2015-10-02 LAB — TROPONIN I
TROPONIN I: 0.03 ng/mL (ref <0.031)
Troponin I: 0.03 ng/mL (ref <0.031)

## 2015-10-02 MED ORDER — NYSTATIN 100000 UNIT/GM EX POWD: CUTANEOUS | Status: DC

## 2015-10-02 NOTE — ED Notes (Signed)
Found sitting on the floor in her room at [facility], pt c/o so me pain near tailbone, does not know what happen, states she has not felt well, hx dementia

## 2015-10-02 NOTE — ED Notes (Addendum)
Report given to Wal[facility] at [facility] Pulse ox of 79% is not correct. Patient had taken pulse ox off.

## 2015-10-02 NOTE — ED Notes (Signed)
Pt found on floor at [facility], pt c/o pain near tailbone, has demetia, will follow commands, in nad

## 2015-10-02 NOTE — ED Provider Notes (Signed)
[facility] Emergency Department Provider Note  ____________________________________________  Time seen: Approximately 12:05 PM  I have reviewed the triage vital signs and the nursing notes.   HISTORY  Chief Complaint Fall    HPI Marie Hicks is a 81 y.o. female with a history of dementia who is presenting today after a fall at her skilled nursing facility. She says she was walking with her cane when she slipped and fell backward onto her tailbone. She is complaining of pain in her tailbone as well as her bilateral hips. She denies hitting her head or losing consciousness. However, she does not room the details of the fall. Denies any headache, weakness or numbness at this time.Denies any chest pain or shortness of breath or racing heartbeat prior to the fall.   Past Medical History  Diagnosis Date  . Breast cancer ([place])   . Gout   . A-fib ([place])   . Arthritis   . Hypertension   . Shortness of breath dyspnea     with exertion  . Dementia     son and daughter are POA  . Anxiety   . HOH (hard of hearing)   . Vertigo     past hx    Patient Active Problem List   Diagnosis Date Noted  . Dementia 06/09/2014  . Benign essential HTN 03/08/2014  . LBP (low back pain) 03/08/2014  . HLD (hyperlipidemia) 03/08/2014  . Diabetes mellitus ([place]) 03/08/2014    Past Surgical History  Procedure Laterality Date  . Mastectomy    . Cholecystectomy    . Photocoagulation with laser Right 09/11/2015    Procedure: PHOTOCOAGULATION WITH LASER;  Surgeon: Ronnell Freshwater, MD;  Location: [facility];  Service: Ophthalmology;  Laterality: Right;  NEEDS REGULAR LASER PROBE NOT MICROPULSE    Current Outpatient Rx  Name  Route  Sig  Dispense  Refill  . bimatoprost (LUMIGAN) 0.01 % SOLN   Right Eye   Place 1 drop into the right eye at bedtime.         . citalopram (CELEXA) 10 MG tablet   Oral   Take 10 mg by mouth daily.         Marland Kitchen letrozole  (FEMARA) 2.5 MG tablet   Oral   Take 1 tablet (2.5 mg total) by mouth daily.   30 tablet   6   . lisinopril (PRINIVIL,ZESTRIL) 10 MG tablet   Oral   Take 10 mg by mouth daily.          Marland Kitchen LORazepam (ATIVAN) 0.5 MG tablet   Oral   Take 0.5 mg by mouth at bedtime as needed (for behavior).          . Multiple Vitamin (MULTI-VITAMINS) TABS   Oral   Take 1 tablet by mouth daily.          . risperiDONE (RISPERDAL) 1 MG tablet   Oral   Take 1 mg by mouth at bedtime.         . traZODone (DESYREL) 100 MG tablet   Oral   Take 100 mg by mouth at bedtime.         . triamcinolone cream (KENALOG) 0.1 %   Topical   Apply 1 application topically 2 (two) times daily as needed (for rash/irritation on stomach folds).            Allergies Aliskiren; Donepezil; Donepezil hcl; Exelon; and Penicillin g potassium in d5w  No family history on file.  Social History Social History  Substance Use Topics  . Smoking status: Never Smoker   . Smokeless tobacco: Never Used  . Alcohol Use: No    Review of Systems Constitutional: No fever/chills Eyes: No visual changes. ENT: No sore throat. Cardiovascular: Denies chest pain. Respiratory: Denies shortness of breath. Gastrointestinal: No abdominal pain.  No nausea, no vomiting.  No diarrhea.  No constipation. Genitourinary: Negative for dysuria. Musculoskeletal: As above  Skin: Negative for rash. Neurological: Negative for headaches, focal weakness or numbness.  10-point ROS otherwise negative.  ____________________________________________   PHYSICAL EXAM:  VITAL SIGNS: ED Triage Vitals  Enc Vitals Group     BP 10/02/15 1128 152/84 mmHg     Pulse Rate 10/02/15 1128 68     Resp 10/02/15 1128 20     Temp 10/02/15 1128 97.8 F (36.6 C)     Temp Source 10/02/15 1128 Oral     SpO2 10/02/15 1128 100 %     Weight 10/02/15 1128 180 lb (81.647 kg)     Height 10/02/15 1128 5\' 8"  (1.727 m)     Head Cir --      Peak Flow --       Pain Score --      Pain Loc --      Pain Edu? --      Excl. in [place]? --     Constitutional: Alert and oriented. Well appearing and in no acute distress. Eyes: Conjunctivae are normal. PERRL. EOMI. Head: Atraumatic. Nose: No congestion/rhinnorhea. Mouth/Throat: Mucous membranes are moist.  Oropharynx non-erythematous. Neck: No stridor.  No tenderness to the C-spine. No step-off or deformity. Cardiovascular: Normal rate, regular rhythm. Grossly normal heart sounds.  Good peripheral circulation. Respiratory: Normal respiratory effort.  No retractions. Lungs CTAB. Gastrointestinal: Soft and nontender. No distention. No abdominal bruits. No CVA tenderness. Musculoskeletal: Mild to moderate bilateral lower extremity edema.  No joint effusions. Mild tenderness to the bilateral hips. No tenderness to the spine or step-off or deformity. No tenderness to the coccyx. The limbs are not shortened or rotated. Neurologic:  Normal speech and language. No gross focal neurologic deficits are appreciated. No gait instability. Skin:  Skin is warm, dry and intact. Erythematous skin under the patient's pannus that is characterized by patchy areas of erythema. Appears moist.  Psychiatric: Mood and affect are normal. Speech and behavior are normal.  ____________________________________________   LABS (all labs ordered are listed, but only abnormal results are displayed)  Labs Reviewed  CBC WITH DIFFERENTIAL/PLATELET - Abnormal; Notable for the following:    Hemoglobin 11.3 (*)    RDW 15.4 (*)    Platelets 143 (*)    All other components within normal limits  BASIC METABOLIC PANEL - Abnormal; Notable for the following:    Chloride 98 (*)    Glucose, Bld 111 (*)    Creatinine, Ser 1.24 (*)    Calcium 10.4 (*)    GFR calc non Af Amer 39 (*)    GFR calc Af Amer 45 (*)    All other components within normal limits  URINALYSIS COMPLETEWITH MICROSCOPIC ([facility] ONLY) - Abnormal; Notable for the following:     Color, Urine STRAW (*)    APPearance CLEAR (*)    Squamous Epithelial / LPF 0-5 (*)    All other components within normal limits  TROPONIN I  TROPONIN I   ____________________________________________  EKG  ED ECG REPORT I, Doran Stabler, the attending physician, personally viewed and interpreted this ECG.  Date: 10/02/2015  EKG Time: 1138  Rate: 73  Rhythm: atrial fibrillation, rate 73  Axis: Normal axis  Intervals:right bundle branch block  ST&T Change: No ST segment elevation or depression. No abnormal T-wave inversion.  When compared to previous the right bundle branch block is new. However, had atrial fibrillation on her last EKG dated 12/18/2012.  ____________________________________________  RADIOLOGY  No active disease on the patient's chest x-ray, pelvis or sacral coccyx x-ray.  No acute abnormality on the CT of her brain. ____________________________________________   PROCEDURES   ____________________________________________   INITIAL IMPRESSION / ASSESSMENT AND PLAN / ED COURSE  Pertinent labs & imaging results that were available during my care of the patient were reviewed by me and considered in my medical decision making (see chart for details).  ----------------------------------------- 7:08 PM on 10/02/2015 -----------------------------------------  Patient is resting comfortably at this time. No complete to pain. Her family is at the bedside who stated that she is acting at her baseline. The patient will be discharged back to her skilled nursing facility. She had a normal troponin 2 which was unchanged. Did have a new right bundle branch block on her EKG but never complained of chest pain and within normal range troponin I think she is safe for discharge. She did say that she slipped when she fell but I was unsure how much I can trust this secondary to her dementia. However, given the patient's workup it is likely mechanical fall and she is safe  for discharge to home. Explained The plan to the family as well as the patient and they are understanding and willing to comply. ____________________________________________   FINAL CLINICAL IMPRESSION(S) / ED DIAGNOSES  Tinea corporis.  Acute fall.    Orbie Pyo, MD 10/02/15 [phone]

## 2016-02-10 ENCOUNTER — Emergency Department: Payer: Medicare Other

## 2016-02-10 ENCOUNTER — Encounter: Payer: Self-pay | Admitting: Emergency Medicine

## 2016-02-10 ENCOUNTER — Emergency Department
Admission: EM | Admit: 2016-02-10 | Discharge: 2016-02-10 | Disposition: A | Discharge status: Home / Self Care | Payer: Medicare Other | Attending: Emergency Medicine | Admitting: Emergency Medicine

## 2016-02-10 DIAGNOSIS — Z880 Allergy status to penicillin: Secondary | ICD-10-CM | POA: Diagnosis not present

## 2016-02-10 DIAGNOSIS — I10 Essential (primary) hypertension: Secondary | ICD-10-CM | POA: Diagnosis not present

## 2016-02-10 DIAGNOSIS — R531 Weakness: Secondary | ICD-10-CM | POA: Diagnosis not present

## 2016-02-10 DIAGNOSIS — R011 Cardiac murmur, unspecified: Secondary | ICD-10-CM | POA: Diagnosis not present

## 2016-02-10 DIAGNOSIS — R4182 Altered mental status, unspecified: Secondary | ICD-10-CM | POA: Diagnosis not present

## 2016-02-10 DIAGNOSIS — R202 Paresthesia of skin: Secondary | ICD-10-CM | POA: Diagnosis not present

## 2016-02-10 DIAGNOSIS — R509 Fever, unspecified: Secondary | ICD-10-CM | POA: Insufficient documentation

## 2016-02-10 DIAGNOSIS — R1110 Vomiting, unspecified: Secondary | ICD-10-CM | POA: Diagnosis not present

## 2016-02-10 DIAGNOSIS — I499 Cardiac arrhythmia, unspecified: Secondary | ICD-10-CM | POA: Diagnosis not present

## 2016-02-10 DIAGNOSIS — R350 Frequency of micturition: Secondary | ICD-10-CM | POA: Insufficient documentation

## 2016-02-10 DIAGNOSIS — E119 Type 2 diabetes mellitus without complications: Secondary | ICD-10-CM | POA: Diagnosis not present

## 2016-02-10 LAB — URINALYSIS COMPLETE WITH MICROSCOPIC (ARMC ONLY)
BILIRUBIN URINE: NEGATIVE
Bacteria, UA: NONE SEEN
GLUCOSE, UA: NEGATIVE mg/dL
HGB URINE DIPSTICK: NEGATIVE
KETONES UR: NEGATIVE mg/dL
LEUKOCYTES UA: NEGATIVE
NITRITE: NEGATIVE
Protein, ur: 30 mg/dL — AB
SPECIFIC GRAVITY, URINE: 1.013 (ref 1.005–1.030)
pH: 7 (ref 5.0–8.0)

## 2016-02-10 LAB — CBC WITH DIFFERENTIAL/PLATELET
BASOS ABS: 0 10*3/uL (ref 0–0.1)
Basophils Relative: 0 %
EOS ABS: 0 10*3/uL (ref 0–0.7)
EOS PCT: 0 %
HCT: 34.8 % — ABNORMAL LOW (ref 35.0–47.0)
Hemoglobin: 11.2 g/dL — ABNORMAL LOW (ref 12.0–16.0)
LYMPHS PCT: 3 %
Lymphs Abs: 0.5 10*3/uL — ABNORMAL LOW (ref 1.0–3.6)
MCH: 28.7 pg (ref 26.0–34.0)
MCHC: 32.2 g/dL (ref 32.0–36.0)
MCV: 89.1 fL (ref 80.0–100.0)
MONO ABS: 0.9 10*3/uL (ref 0.2–0.9)
Monocytes Relative: 6 %
Neutro Abs: 13.6 10*3/uL — ABNORMAL HIGH (ref 1.4–6.5)
Neutrophils Relative %: 91 %
PLATELETS: 133 10*3/uL — AB (ref 150–440)
RBC: 3.9 MIL/uL (ref 3.80–5.20)
RDW: 15 % — AB (ref 11.5–14.5)
WBC: 15 10*3/uL — ABNORMAL HIGH (ref 3.6–11.0)

## 2016-02-10 LAB — COMPREHENSIVE METABOLIC PANEL
ALBUMIN: 4.3 g/dL (ref 3.5–5.0)
ALT: 14 U/L (ref 14–54)
AST: 30 U/L (ref 15–41)
Alkaline Phosphatase: 65 U/L (ref 38–126)
Anion gap: 6 (ref 5–15)
BUN: 22 mg/dL — ABNORMAL HIGH (ref 6–20)
CO2: 28 mmol/L (ref 22–32)
Calcium: 9.6 mg/dL (ref 8.9–10.3)
Chloride: 102 mmol/L (ref 101–111)
Creatinine, Ser: 1.1 mg/dL — ABNORMAL HIGH (ref 0.44–1.00)
GFR calc Af Amer: 52 mL/min — ABNORMAL LOW (ref >60)
GFR, EST NON AFRICAN AMERICAN: 45 mL/min — AB (ref >60)
Glucose, Bld: 148 mg/dL — ABNORMAL HIGH (ref 65–99)
POTASSIUM: 3.3 mmol/L — AB (ref 3.5–5.1)
SODIUM: 136 mmol/L (ref 135–145)
TOTAL PROTEIN: 7.2 g/dL (ref 6.5–8.1)
Total Bilirubin: 0.8 mg/dL (ref 0.3–1.2)

## 2016-02-10 LAB — LACTIC ACID, PLASMA: Lactic Acid, Venous: 1.1 mmol/L (ref 0.5–2.0)

## 2016-02-10 LAB — RAPID INFLUENZA A&B ANTIGENS
Influenza A (ARMC): NEGATIVE
Influenza B (ARMC): NEGATIVE

## 2016-02-10 LAB — TROPONIN I: TROPONIN I: 0.04 ng/mL — AB (ref <0.031)

## 2016-02-10 MED ORDER — DEXTROSE 5 % IV SOLN: 1.0000 g | Freq: Once | INTRAVENOUS | Status: DC

## 2016-02-10 MED ORDER — CEFTRIAXONE SODIUM 2 G IJ SOLR
2.0000 g | Freq: Once | INTRAMUSCULAR | Status: AC
  Administered 2016-02-10: 2 g via INTRAVENOUS
  Filled 2016-02-10: qty 2

## 2016-02-10 MED ORDER — CIPROFLOXACIN HCL 500 MG PO TABS: 500.0000 mg | ORAL_TABLET | Freq: Two times a day (BID) | ORAL | Status: AC

## 2016-02-10 NOTE — Discharge Instructions (Signed)
Fever, Adult A fever is an increase in the body's temperature. It is usually defined as a temperature of 100F (38C) or higher. Brief mild or moderate fevers generally have no long-term effects, and they often do not require treatment. Moderate or high fevers may make you feel uncomfortable and can sometimes be a sign of a serious illness or disease. The sweating that may occur with repeated or prolonged fever may also cause dehydration. Fever is confirmed by taking a temperature with a thermometer. A measured temperature can vary with:  Age.  Time of day.  Location of the thermometer:  Mouth (oral).  Rectum (rectal).  Ear (tympanic).  Underarm (axillary).  Forehead (temporal). HOME CARE INSTRUCTIONS Pay attention to any changes in your symptoms. Take these actions to help with your condition:  Take over-the counter and prescription medicines only as told by your health care provider. Follow the dosing instructions carefully.  If you were prescribed an antibiotic medicine, take it as told by your health care provider. Do not stop taking the antibiotic even if you start to feel better.  Rest as needed.  Drink enough fluid to keep your urine clear or pale yellow. This helps to prevent dehydration.  Sponge yourself or bathe with room-temperature water to help reduce your body temperature as needed. Do not use ice water.  Do not overbundle yourself in blankets or heavy clothes. SEEK MEDICAL CARE IF:  You vomit.  You cannot eat or drink without vomiting.  You have diarrhea.  You have pain when you urinate.  Your symptoms do not improve with treatment.  You develop new symptoms.  You develop excessive weakness. SEEK IMMEDIATE MEDICAL CARE IF:  You have shortness of breath or have trouble breathing.  You are dizzy or you faint.  You are disoriented or confused.  You develop signs of dehydration, such as a dry mouth, decreased urination, or paleness.  You develop  severe pain in your abdomen.  You have persistent vomiting or diarrhea.  You develop a skin rash.  Your symptoms suddenly get worse.   This information is not intended to replace advice given to you by your health care provider. Make sure you discuss any questions you have with your health care provider.   Document Released: 05/14/2001 Document Revised: 08/09/2015 Document Reviewed: 01/12/2015 Elsevier Interactive Patient Education 2016 [employer]  

## 2016-02-10 NOTE — ED Notes (Signed)
Pt presents from [facility] with her son and family. Per her son pt has had vomiting, fever, chills, urinary frequency and tingling to R side of her face, as well as hypertension. Per pt's son she has hx of dementia at this time.

## 2016-02-10 NOTE — ED Provider Notes (Signed)
[facility] Emergency Department Provider Note   L5 caveat: Review of systems and history is limited by dementia  Time seen: ----------------------------------------- 5:56 PM on 02/10/2016 -----------------------------------------    I have reviewed the triage vital signs and the nursing notes.   HISTORY  Chief Complaint Flu Like Symptoms ; Altered Mental Status; and Urinary Frequency    HPI Marie Hicks is a 82 y.o. female who presents to the ER for vomiting, fever, chills, urinary frequency and tingling to the right side of her face today. Blood pressure is also noted to be elevated according to the family. She has a history of dementia but she is not acting normally and cannot take more than a few steps. Son was concerned she may have a UTI, she just states she does not feel well.   Past Medical History  Diagnosis Date  . Breast cancer ([place])   . Gout   . A-fib ([place])   . Arthritis   . Hypertension   . Shortness of breath dyspnea     with exertion  . Dementia     son and daughter are POA  . Anxiety   . HOH (hard of hearing)   . Vertigo     past hx    Patient Active Problem List   Diagnosis Date Noted  . Dementia 06/09/2014  . Benign essential HTN 03/08/2014  . LBP (low back pain) 03/08/2014  . HLD (hyperlipidemia) 03/08/2014  . Diabetes mellitus ([place]) 03/08/2014    Past Surgical History  Procedure Laterality Date  . Mastectomy    . Cholecystectomy    . Photocoagulation with laser Right 09/11/2015    Procedure: PHOTOCOAGULATION WITH LASER;  Surgeon: Ronnell Freshwater, MD;  Location: [place];  Service: Ophthalmology;  Laterality: Right;  NEEDS REGULAR LASER PROBE NOT MICROPULSE    Allergies Aliskiren; Donepezil; Donepezil hcl; Exelon; and Penicillin g potassium in d5w  Social History Social History  Substance Use Topics  . Smoking status: Never Smoker   . Smokeless tobacco: Never Used  . Alcohol Use: No     Review of Systems Constitutional: Positive for fever and shaking chills Eyes: Negative for visual changes. ENT: Negative for sore throat. Cardiovascular: Negative for chest pain. Respiratory: Negative for shortness of breath. Gastrointestinal: Negative for abdominal pain, positive for vomiting Genitourinary: Negative for dysuria. Musculoskeletal: Negative for back pain. Skin: Negative for rash. Neurological: Positive for weakness  10-point ROS otherwise negative.  ____________________________________________   PHYSICAL EXAM:  VITAL SIGNS: ED Triage Vitals  Enc Vitals Group     BP 02/10/16 1605 177/118 mmHg     Pulse Rate 02/10/16 1605 102     Resp 02/10/16 1605 20     Temp 02/10/16 1605 98 F (36.7 C)     Temp Source 02/10/16 1605 Oral     SpO2 02/10/16 1605 95 %     Weight 02/10/16 1605 218 lb (98.884 kg)     Height 02/10/16 1605 5\' 4"  (1.626 m)     Head Cir --      Peak Flow --      Pain Score --      Pain Loc --      Pain Edu? --      Excl. in [place]? --     Constitutional: Alert , No acute distress Eyes: Conjunctivae are normal. PERRL. Normal extraocular movements. ENT   Head: Normocephalic and atraumatic.   Nose: No congestion/rhinnorhea.   Mouth/Throat: Mucous membranes are moist.  Neck: No stridor. Cardiovascular: Rapid rate, irregular rhythm. Harsh holosystolic murmur heard at the upper left sternal border  Respiratory: Normal respiratory effort without tachypnea nor retractions. Breath sounds are clear and equal bilaterally.  Gastrointestinal: Soft and nontender. No distention. No abdominal bruits.  Musculoskeletal: Nontender with normal range of motion in all extremities.  Neurologic:  Normal speech and language. No gross focal neurologic deficits are appreciated.  Skin:  Skin is warm, dry and intact. No rash noted. ____________________________________________  EKG: Interpreted by me. A 2 fibrillation with a rapid ventricular response  with a rate of 107 bpm, wide QRS, long QT interval, right bundle branch block.  ____________________________________________  ED COURSE:  Pertinent labs & imaging results that were available during my care of the patient were reviewed by me and considered in my medical decision making (see chart for details). Patient with fever and systemic symptoms. We'll assess for sepsis. ____________________________________________    LABS (pertinent positives/negatives)  Labs Reviewed  CBC WITH DIFFERENTIAL/PLATELET - Abnormal; Notable for the following:    WBC 15.0 (*)    Hemoglobin 11.2 (*)    HCT 34.8 (*)    RDW 15.0 (*)    Platelets 133 (*)    Neutro Abs 13.6 (*)    Lymphs Abs 0.5 (*)    All other components within normal limits  COMPREHENSIVE METABOLIC PANEL - Abnormal; Notable for the following:    Potassium 3.3 (*)    Glucose, Bld 148 (*)    BUN 22 (*)    Creatinine, Ser 1.10 (*)    GFR calc non Af Amer 45 (*)    GFR calc Af Amer 52 (*)    All other components within normal limits  TROPONIN I - Abnormal; Notable for the following:    Troponin I 0.04 (*)    All other components within normal limits  URINALYSIS COMPLETEWITH MICROSCOPIC ([facility] ONLY) - Abnormal; Notable for the following:    Color, Urine YELLOW (*)    APPearance CLEAR (*)    Protein, ur 30 (*)    Squamous Epithelial / LPF 0-5 (*)    All other components within normal limits  RAPID INFLUENZA A&B ANTIGENS ([facility] ONLY)  CULTURE, BLOOD (ROUTINE X 2)  CULTURE, BLOOD (ROUTINE X 2)  URINE CULTURE  LACTIC ACID, PLASMA    RADIOLOGY Images were viewed by me  Chest x-ray, CT head IMPRESSION: 1. Mild diffuse peribronchial cuffing, suggestive of an acute bronchitis. 2. Mild cardiomegaly. 3. Atherosclerosis.  IMPRESSION: 1. No acute intracranial abnormalities. 2. Mild cerebral atrophy with chronic microvascular ischemic changes in cerebral white matter redemonstrated. 3. 9 x 4 mm right parietal extra-axial lesion  likely to represent a small meningioma. ____________________________________________  FINAL ASSESSMENT AND PLAN  Fever of unknown origin  Plan: Patient with labs and imaging as dictated above. No clear etiology for her symptoms. I have sent urine and blood cultures. She is given 2 g of IV Rocephin. I offered admission to the hospital with the family. They feel that sundowning would be, significant problem for her in the hospital. She'll be discharged with Cipro and she will be advised to have follow-up in the next 24 hours with her doctor.   Earleen Newport, MD   Earleen Newport, MD 02/10/16 2113

## 2016-02-10 NOTE — ED Notes (Signed)
Pt leaving ED w/ family, family to transport back to [facility]

## 2016-02-13 LAB — URINE CULTURE: Special Requests: NORMAL

## 2016-02-15 LAB — CULTURE, BLOOD (ROUTINE X 2)
CULTURE: NO GROWTH
Culture: NO GROWTH

## 2016-03-25 ENCOUNTER — Other Ambulatory Visit: Payer: Self-pay | Admitting: Oncology

## 2016-03-25 ENCOUNTER — Ambulatory Visit
Admission: RE | Admit: 2016-03-25 | Discharge: 2016-03-25 | Disposition: A | Discharge status: Home / Self Care | Payer: Medicare Other | Source: Ambulatory Visit | Attending: Oncology | Admitting: Oncology

## 2016-03-25 DIAGNOSIS — C50912 Malignant neoplasm of unspecified site of left female breast: Secondary | ICD-10-CM

## 2016-03-25 DIAGNOSIS — R921 Mammographic calcification found on diagnostic imaging of breast: Secondary | ICD-10-CM | POA: Insufficient documentation

## 2016-03-25 DIAGNOSIS — Z853 Personal history of malignant neoplasm of breast: Secondary | ICD-10-CM | POA: Diagnosis present

## 2016-03-25 DIAGNOSIS — Z9889 Other specified postprocedural states: Secondary | ICD-10-CM | POA: Insufficient documentation

## 2016-04-15 ENCOUNTER — Inpatient Hospital Stay: Payer: Medicare Other | Attending: Family Medicine

## 2016-04-15 ENCOUNTER — Other Ambulatory Visit: Payer: Medicare Other

## 2016-04-15 ENCOUNTER — Inpatient Hospital Stay (HOSPITAL_BASED_OUTPATIENT_CLINIC_OR_DEPARTMENT_OTHER): Payer: Medicare Other | Admitting: Family Medicine

## 2016-04-15 ENCOUNTER — Ambulatory Visit: Payer: Medicare Other | Admitting: Oncology

## 2016-04-15 VITALS — BP 164/89 | HR 70 | Temp 96.3°F | Resp 18

## 2016-04-15 DIAGNOSIS — Z170 Estrogen receptor positive status [ER+]: Secondary | ICD-10-CM

## 2016-04-15 DIAGNOSIS — F419 Anxiety disorder, unspecified: Secondary | ICD-10-CM | POA: Diagnosis not present

## 2016-04-15 DIAGNOSIS — Z79811 Long term (current) use of aromatase inhibitors: Secondary | ICD-10-CM

## 2016-04-15 DIAGNOSIS — D649 Anemia, unspecified: Secondary | ICD-10-CM

## 2016-04-15 DIAGNOSIS — I10 Essential (primary) hypertension: Secondary | ICD-10-CM | POA: Insufficient documentation

## 2016-04-15 DIAGNOSIS — M109 Gout, unspecified: Secondary | ICD-10-CM | POA: Diagnosis not present

## 2016-04-15 DIAGNOSIS — C50912 Malignant neoplasm of unspecified site of left female breast: Secondary | ICD-10-CM | POA: Diagnosis not present

## 2016-04-15 DIAGNOSIS — C50919 Malignant neoplasm of unspecified site of unspecified female breast: Secondary | ICD-10-CM

## 2016-04-15 DIAGNOSIS — I4891 Unspecified atrial fibrillation: Secondary | ICD-10-CM | POA: Insufficient documentation

## 2016-04-15 DIAGNOSIS — F0390 Unspecified dementia without behavioral disturbance: Secondary | ICD-10-CM | POA: Insufficient documentation

## 2016-04-15 DIAGNOSIS — Z923 Personal history of irradiation: Secondary | ICD-10-CM | POA: Insufficient documentation

## 2016-04-15 DIAGNOSIS — H9190 Unspecified hearing loss, unspecified ear: Secondary | ICD-10-CM | POA: Diagnosis not present

## 2016-04-15 DIAGNOSIS — M1990 Unspecified osteoarthritis, unspecified site: Secondary | ICD-10-CM | POA: Diagnosis not present

## 2016-04-15 LAB — CBC WITH DIFFERENTIAL/PLATELET
BASOS ABS: 0 10*3/uL (ref 0–0.1)
BASOS PCT: 1 %
Eosinophils Absolute: 0.1 10*3/uL (ref 0–0.7)
Eosinophils Relative: 2 %
HEMATOCRIT: 27.9 % — AB (ref 35.0–47.0)
HEMOGLOBIN: 9.2 g/dL — AB (ref 12.0–16.0)
LYMPHS ABS: 1.1 10*3/uL (ref 1.0–3.6)
LYMPHS PCT: 17 %
MCH: 28.9 pg (ref 26.0–34.0)
MCHC: 32.9 g/dL (ref 32.0–36.0)
MCV: 87.8 fL (ref 80.0–100.0)
MONOS PCT: 9 %
Monocytes Absolute: 0.6 10*3/uL (ref 0.2–0.9)
NEUTROS ABS: 4.8 10*3/uL (ref 1.4–6.5)
NEUTROS PCT: 71 %
Platelets: 174 10*3/uL (ref 150–440)
RBC: 3.18 MIL/uL — AB (ref 3.80–5.20)
RDW: 14.9 % — ABNORMAL HIGH (ref 11.5–14.5)
WBC: 6.6 10*3/uL (ref 3.6–11.0)

## 2016-04-15 LAB — COMPREHENSIVE METABOLIC PANEL
ALBUMIN: 3.5 g/dL (ref 3.5–5.0)
ALK PHOS: 76 U/L (ref 38–126)
ALT: 15 U/L (ref 14–54)
ANION GAP: 9 (ref 5–15)
AST: 28 U/L (ref 15–41)
BUN: 18 mg/dL (ref 6–20)
CALCIUM: 9.1 mg/dL (ref 8.9–10.3)
CO2: 28 mmol/L (ref 22–32)
Chloride: 101 mmol/L (ref 101–111)
Creatinine, Ser: 1.14 mg/dL — ABNORMAL HIGH (ref 0.44–1.00)
GFR calc Af Amer: 50 mL/min — ABNORMAL LOW (ref >60)
GFR calc non Af Amer: 43 mL/min — ABNORMAL LOW (ref >60)
GLUCOSE: 125 mg/dL — AB (ref 65–99)
POTASSIUM: 3.6 mmol/L (ref 3.5–5.1)
SODIUM: 138 mmol/L (ref 135–145)
Total Bilirubin: 0.5 mg/dL (ref 0.3–1.2)
Total Protein: 6.3 g/dL — ABNORMAL LOW (ref 6.5–8.1)

## 2016-04-15 LAB — IRON AND TIBC
Iron: 16 ug/dL — ABNORMAL LOW (ref 28–170)
SATURATION RATIOS: 7 % — AB (ref 10.4–31.8)
TIBC: 236 ug/dL — ABNORMAL LOW (ref 250–450)
UIBC: 220 ug/dL

## 2016-04-15 LAB — FOLATE: FOLATE: 47 ng/mL (ref >5.9)

## 2016-04-15 LAB — FERRITIN: Ferritin: 141 ng/mL (ref 11–307)

## 2016-04-15 NOTE — Progress Notes (Signed)
[place]  [phone]  [phone]     Marie Hicks DOB: October 21, 1929  MR#: [id number]  WER#:[id number]  Patient Care Team: Derinda Late, MD as PCP - General (Family Medicine)  CHIEF COMPLAINT:  Chief Complaint  Patient presents with  . Breast Cancer    INTERVAL HISTORY: Patient is here for further follow-up regarding carcinoma of the left breast. Patient remains on letrozole and is tolerating fairly well. She is a poor historian due to dementia as well as poor hearing. She currently resides at [facility] assisted living facilities. Her daughter is present and reports that her mom has been following more frequently. She states she has multiple skin tears over her arms and has fallen several times and hit her head. Overall performance status is declining.   REVIEW OF SYSTEMS:   Review of Systems  Constitutional: Negative for fever, chills, weight loss, malaise/fatigue and diaphoresis.  HENT: Negative.   Eyes: Negative.   Respiratory: Negative for cough, hemoptysis, sputum production, shortness of breath and wheezing.   Cardiovascular: Positive for leg swelling. Negative for chest pain, palpitations, orthopnea, claudication and PND.  Gastrointestinal: Negative for heartburn, nausea, vomiting, abdominal pain, diarrhea, constipation, blood in stool and melena.  Genitourinary: Negative.   Musculoskeletal: Positive for myalgias, joint pain and falls.  Skin:       Skin tears  Neurological: Positive for weakness. Negative for dizziness, tingling, focal weakness and seizures.  Endo/Heme/Allergies: Bruises/bleeds easily.  Psychiatric/Behavioral: Positive for memory loss. Negative for depression. The patient is not nervous/anxious and does not have insomnia.     As per HPI. Otherwise, a complete review of systems is negatve.  ONCOLOGY HISTORY: Oncology History   94. 82 year old female status post wide local excision and sentinel node biopsy for a  pathologic stage I (T1 A. N0 M0) ER/PR positive, HER-2/neu negative invasive mammary carcinoma. 2. Patient has finished partial breast radiation therapy. 3. She had local reaction in the breast which is now healed. 82 year old female status post wide local excision and sentinel node biopsy for a pathologic stage I (T1 A. N0 M0) ER/PR positive HER-2/neu negative invasive mammary carcinoma diagnosis on March 30, 2012     Breast cancer [facility])   04/15/2016 Initial Diagnosis Breast cancer ([place])    PAST MEDICAL HISTORY: Past Medical History  Diagnosis Date  . Breast cancer ([place])   . Gout   . A-fib ([place])   . Arthritis   . Hypertension   . Shortness of breath dyspnea     with exertion  . Dementia     son and daughter are POA  . Anxiety   . HOH (hard of hearing)   . Vertigo     past hx    PAST SURGICAL HISTORY: Past Surgical History  Procedure Laterality Date  . Cholecystectomy    . Photocoagulation with laser Right 09/11/2015    Procedure: PHOTOCOAGULATION WITH LASER;  Surgeon: Ronnell Freshwater, MD;  Location: [place];  Service: Ophthalmology;  Laterality: Right;  NEEDS REGULAR LASER PROBE NOT MICROPULSE  . Breast lumpectomy Left 2013    lumpectomy    FAMILY HISTORY No family history on file.  GYNECOLOGIC HISTORY:  No LMP recorded. Patient is postmenopausal.     ADVANCED DIRECTIVES:    HEALTH MAINTENANCE: Social History  Substance Use Topics  . Smoking status: Never Smoker   . Smokeless tobacco: Never Used  . Alcohol Use: No     Colonoscopy:  PAP:  Bone density:  Mammogram:April  2017  Allergies  Allergen Reactions  . Aliskiren Other (See Comments)    cramps Other reaction(s): Other (See Comments) cramps  . Donepezil Hcl Nausea Only  . Exelon [Rivastigmine] Rash  . Penicillin G Potassium In D5w Rash    Current Outpatient Prescriptions  Medication Sig Dispense Refill  . allopurinol (ZYLOPRIM) 300 MG tablet Take 300 mg by mouth daily.     . bimatoprost (LUMIGAN) 0.01 % SOLN Place 1 drop into the right eye at bedtime.    . Brinzolamide-Brimonidine (SIMBRINZA) 1-0.2 % SUSP Place 1 drop into the right eye 3 (three) times daily.    . Calcium-Vitamin D-Vitamin K 500-100-40 MG-UNT-MCG CHEW Chew 1 capsule by mouth 2 (two) times daily.    . citalopram (CELEXA) 10 MG tablet Take 10 mg by mouth daily.    . hydroxypropyl methylcellulose / hypromellose (ISOPTO TEARS / GONIOVISC) 2.5 % ophthalmic solution Place 1 drop into both eyes 4 (four) times daily.    Marland Kitchen letrozole (FEMARA) 2.5 MG tablet Take 1 tablet (2.5 mg total) by mouth daily. 30 tablet 6  . lisinopril (PRINIVIL,ZESTRIL) 10 MG tablet Take 10 mg by mouth daily.     . Multiple Vitamin (MULTI-VITAMINS) TABS Take 1 tablet by mouth daily.     Marland Kitchen nystatin (MYCOSTATIN) powder Apply 1 Dose topically daily as needed.    . risperiDONE (RISPERDAL) 1 MG tablet Take 1 mg by mouth at bedtime.    . traZODone (DESYREL) 100 MG tablet Take 100 mg by mouth at bedtime.    . triamcinolone cream (KENALOG) 0.1 % Apply 1 application topically 2 (two) times daily as needed (for rash/irritation on stomach folds).      No current facility-administered medications for this visit.    OBJECTIVE: BP 164/89 mmHg  Pulse 70  Temp(Src) 96.3 F (35.7 C) (Tympanic)  Resp 18  Wt    There is no weight on file to calculate BMI.    ECOG FS:3 - Symptomatic, >50% confined to bed  General: Well-developed, well-nourished, no acute distress. Sitting wheelchair. Eyes: Pink conjunctiva, anicteric sclera. HEENT: Normocephalic, moist mucous membranes, clear oropharnyx. Lungs: Clear to auscultation bilaterally. Heart: Systolic murmur grade 4/6  Abdomen: Soft, nontender, nondistended. No organomegaly noted, normoactive bowel sounds. Breast: Deferred at request of patient.  Musculoskeletal: Trace lower extremity edema, no cyanosis, or clubbing. Neuro: Demented. Alert to self only. Cranial nerves grossly intact. Skin:  Various skin tears and ecchymosis in numerous stages of healing. Psych: Normal affect.   LAB RESULTS:  Appointment on 04/15/2016  Component Date Value Ref Range Status  . WBC 04/15/2016 6.6  3.6 - 11.0 K/uL Final  . RBC 04/15/2016 3.18* 3.80 - 5.20 MIL/uL Final  . Hemoglobin 04/15/2016 9.2* 12.0 - 16.0 g/dL Final  . HCT 04/15/2016 27.9* 35.0 - 47.0 % Final  . MCV 04/15/2016 87.8  80.0 - 100.0 fL Final  . MCH 04/15/2016 28.9  26.0 - 34.0 pg Final  . MCHC 04/15/2016 32.9  32.0 - 36.0 g/dL Final  . RDW 04/15/2016 14.9* 11.5 - 14.5 % Final  . Platelets 04/15/2016 174  150 - 440 K/uL Final  . Neutrophils Relative % 04/15/2016 71   Final  . Neutro Abs 04/15/2016 4.8  1.4 - 6.5 K/uL Final  . Lymphocytes Relative 04/15/2016 17   Final  . Lymphs Abs 04/15/2016 1.1  1.0 - 3.6 K/uL Final  . Monocytes Relative 04/15/2016 9   Final  . Monocytes Absolute 04/15/2016 0.6  0.2 - 0.9 K/uL Final  .  Eosinophils Relative 04/15/2016 2   Final  . Eosinophils Absolute 04/15/2016 0.1  0 - 0.7 K/uL Final  . Basophils Relative 04/15/2016 1   Final  . Basophils Absolute 04/15/2016 0.0  0 - 0.1 K/uL Final  . Sodium 04/15/2016 138  135 - 145 mmol/L Final  . Potassium 04/15/2016 3.6  3.5 - 5.1 mmol/L Final  . Chloride 04/15/2016 101  101 - 111 mmol/L Final  . CO2 04/15/2016 28  22 - 32 mmol/L Final  . Glucose, Bld 04/15/2016 125* 65 - 99 mg/dL Final  . BUN 04/15/2016 18  6 - 20 mg/dL Final  . Creatinine, Ser 04/15/2016 1.14* 0.44 - 1.00 mg/dL Final  . Calcium 04/15/2016 9.1  8.9 - 10.3 mg/dL Final  . Total Protein 04/15/2016 6.3* 6.5 - 8.1 g/dL Final  . Albumin 04/15/2016 3.5  3.5 - 5.0 g/dL Final  . AST 04/15/2016 28  15 - 41 U/L Final  . ALT 04/15/2016 15  14 - 54 U/L Final  . Alkaline Phosphatase 04/15/2016 76  38 - 126 U/L Final  . Total Bilirubin 04/15/2016 0.5  0.3 - 1.2 mg/dL Final  . GFR calc non Af Amer 04/15/2016 43* >60 mL/min Final  . GFR calc Af Amer 04/15/2016 50* >60 mL/min Final    Comment: (NOTE) The eGFR has been calculated using the CKD EPI equation. This calculation has not been validated in all clinical situations. eGFR's persistently <60 mL/min signify possible Chronic Kidney Disease.   . Anion gap 04/15/2016 9  5 - 15 Final    STUDIES: No results found.  ASSESSMENT:   Carcinoma of left breast, stage I, T1 N0 M0, ER/PR positive HER-2/neu negative.  PLAN:   1. Left breast cancer. Patient recently had mammogram performed in April 2017 that was reported as BI-RADS 2, benign. Patient requested not to have a breast exam today as she just had a mammogram. Patient continues to tolerate letrozole fairly well. Patient's overall performance status continues to decline due to multiple falls, dementia. 2. Anemia. Patient noted to have a significant drop in hemoglobin from March 2017 to today's visit. Hemoglobin has dropped from 11.2-9.2. Patient denies any blood in her stool or other active bleeding however, patient is not a good historian.  Per discussion with daughter Langley Gauss, plan is to check some additional labs today including iron, ferritin, vitamin B12 levels. Patient has follow-up with Dr. Baldemar Lenis tomorrow. Requested the daughter have Dr. Baldemar Lenis review those results with him in the office tomorrow morning, we will also be in touch with additional follow-up appointments if needed. If iron deficiency is found IV iron infusions may be beneficial. Daughter however does not want to be aggressive at this time in finding out the cause of any anemia. We'll rediscuss when results are completed.  Patient expressed understanding and was in agreement with this plan. She also understands that She can call clinic at any time with any questions, concerns, or complaints.   Dr. Rogue Bussing was available for consultation and review of plan of care for this patient.   Evlyn Kanner, NP   04/15/2016 2:51 PM

## 2016-04-15 NOTE — Progress Notes (Signed)
Offers no complaints. States is feeling well. Per pt's family member, pt is having difficulty maintaining balance but otherwise is doing well. Recently had mammogram which was normal per pt family member.

## 2016-04-16 LAB — VITAMIN B12: Vitamin B-12: 710 pg/mL (ref 180–914)

## 2016-04-19 ENCOUNTER — Telehealth: Payer: Self-pay | Admitting: Family Medicine

## 2016-04-19 NOTE — Telephone Encounter (Signed)
Contacted family to discuss results of iron studies. She would be a candidate for iv iron therapy. Son stated they would call me back but have not received a call. Original call placed Monday 04/15/2016.

## 2016-04-24 ENCOUNTER — Other Ambulatory Visit: Payer: Medicare Other

## 2016-04-24 ENCOUNTER — Ambulatory Visit: Payer: Medicare Other | Admitting: Oncology

## 2016-05-01 ENCOUNTER — Emergency Department
Admission: EM | Admit: 2016-05-01 | Discharge: 2016-05-01 | Disposition: A | Discharge status: Home / Self Care | Payer: Medicare Other | Attending: Emergency Medicine | Admitting: Emergency Medicine

## 2016-05-01 ENCOUNTER — Emergency Department: Payer: Medicare Other

## 2016-05-01 ENCOUNTER — Encounter: Payer: Self-pay | Admitting: Emergency Medicine

## 2016-05-01 DIAGNOSIS — Y999 Unspecified external cause status: Secondary | ICD-10-CM | POA: Insufficient documentation

## 2016-05-01 DIAGNOSIS — Z853 Personal history of malignant neoplasm of breast: Secondary | ICD-10-CM | POA: Diagnosis not present

## 2016-05-01 DIAGNOSIS — W0110XA Fall on same level from slipping, tripping and stumbling with subsequent striking against unspecified object, initial encounter: Secondary | ICD-10-CM | POA: Diagnosis not present

## 2016-05-01 DIAGNOSIS — W19XXXA Unspecified fall, initial encounter: Secondary | ICD-10-CM

## 2016-05-01 DIAGNOSIS — S0101XA Laceration without foreign body of scalp, initial encounter: Secondary | ICD-10-CM | POA: Insufficient documentation

## 2016-05-01 DIAGNOSIS — Y929 Unspecified place or not applicable: Secondary | ICD-10-CM | POA: Diagnosis not present

## 2016-05-01 DIAGNOSIS — I10 Essential (primary) hypertension: Secondary | ICD-10-CM | POA: Diagnosis not present

## 2016-05-01 DIAGNOSIS — I4891 Unspecified atrial fibrillation: Secondary | ICD-10-CM | POA: Diagnosis not present

## 2016-05-01 DIAGNOSIS — M1990 Unspecified osteoarthritis, unspecified site: Secondary | ICD-10-CM | POA: Insufficient documentation

## 2016-05-01 DIAGNOSIS — E119 Type 2 diabetes mellitus without complications: Secondary | ICD-10-CM | POA: Diagnosis not present

## 2016-05-01 DIAGNOSIS — Y9389 Activity, other specified: Secondary | ICD-10-CM | POA: Diagnosis not present

## 2016-05-01 DIAGNOSIS — R51 Headache: Secondary | ICD-10-CM | POA: Insufficient documentation

## 2016-05-01 MED ORDER — LIDOCAINE-EPINEPHRINE (PF) 1 %-1:200000 IJ SOLN
INTRAMUSCULAR | Status: AC
  Administered 2016-05-01: 13:00:00
  Filled 2016-05-01: qty 30

## 2016-05-01 NOTE — ED Notes (Addendum)
Pt presents to ED from [facility] for a witnessed fall this morning. Pt daughter states staff report her feet got tangled under her. Pt presents with scalp laceration to top back of head. Swelling noted to area around laceration. Bleeding currently controlled. Pt son denies LOC. Pt alert but disoriented to date. Pt daughter reports pt has known dementia.

## 2016-05-01 NOTE — ED Notes (Signed)
Pt returned from CT, resting in bed, eyes closed, no acute distress, family at bedside

## 2016-05-01 NOTE — Discharge Instructions (Signed)
You have been seen in the emergency department today after a fall. You have suffered a laceration to the back of her scalp. This has been repaired with staples. You may wash your hair as normal including today. Air dry, do not use towel as this could disrupt staples. Apply Neosporin once dried to the area. Follow-up with your primary care doctor in 1 week for staple removal.    Laceration Care, Adult A laceration is a cut that goes through all of the layers of the skin and into the tissue that is right under the skin. Some lacerations heal on their own. Others need to be closed with stitches (sutures), staples, skin adhesive strips, or skin glue. Proper laceration care minimizes the risk of infection and helps the laceration to heal better. HOW TO CARE FOR YOUR LACERATION If sutures or staples were used:  Keep the wound clean and dry.  If you were given a bandage (dressing), you should change it at least one time per day or as told by your health care provider. You should also change it if it becomes wet or dirty.  Keep the wound completely dry for the first 24 hours or as told by your health care provider. After that time, you may shower or bathe. However, make sure that the wound is not soaked in water until after the sutures or staples have been removed.  Clean the wound one time each day or as told by your health care provider:  Wash the wound with soap and water.  Rinse the wound with water to remove all soap.  Pat the wound dry with a clean towel. Do not rub the wound.  After cleaning the wound, apply a thin layer of antibiotic ointmentas told by your health care provider. This will help to prevent infection and keep the dressing from sticking to the wound.  Have the sutures or staples removed as told by your health care provider. If skin adhesive strips were used:  Keep the wound clean and dry.  If you were given a bandage (dressing), you should change it at least one time per  day or as told by your health care provider. You should also change it if it becomes dirty or wet.  Do not get the skin adhesive strips wet. You may shower or bathe, but be careful to keep the wound dry.  If the wound gets wet, pat it dry with a clean towel. Do not rub the wound.  Skin adhesive strips fall off on their own. You may trim the strips as the wound heals. Do not remove skin adhesive strips that are still stuck to the wound. They will fall off in time. If skin glue was used:  Try to keep the wound dry, but you may briefly wet it in the shower or bath. Do not soak the wound in water, such as by swimming.  After you have showered or bathed, gently pat the wound dry with a clean towel. Do not rub the wound.  Do not do any activities that will make you sweat heavily until the skin glue has fallen off on its own.  Do not apply liquid, cream, or ointment medicine to the wound while the skin glue is in place. Using those may loosen the film before the wound has healed.  If you were given a bandage (dressing), you should change it at least one time per day or as told by your health care provider. You should also change it if  it becomes dirty or wet.  If a dressing is placed over the wound, be careful not to apply tape directly over the skin glue. Doing that may cause the glue to be pulled off before the wound has healed.  Do not pick at the glue. The skin glue usually remains in place for 5-10 days, then it falls off of the skin. General Instructions  Take over-the-counter and prescription medicines only as told by your health care provider.  If you were prescribed an antibiotic medicine or ointment, take or apply it as told by your doctor. Do not stop using it even if your condition improves.  To help prevent scarring, make sure to cover your wound with sunscreen whenever you are outside after stitches are removed, after adhesive strips are removed, or when glue remains in place and  the wound is healed. Make sure to wear a sunscreen of at least 30 SPF.  Do not scratch or pick at the wound.  Keep all follow-up visits as told by your health care provider. This is important.  Check your wound every day for signs of infection. Watch for:  Redness, swelling, or pain.  Fluid, blood, or pus.  Raise (elevate) the injured area above the level of your heart while you are sitting or lying down, if possible. SEEK MEDICAL CARE IF:  You received a tetanus shot and you have swelling, severe pain, redness, or bleeding at the injection site.  You have a fever.  A wound that was closed breaks open.  You notice a bad smell coming from your wound or your dressing.  You notice something coming out of the wound, such as wood or glass.  Your pain is not controlled with medicine.  You have increased redness, swelling, or pain at the site of your wound.  You have fluid, blood, or pus coming from your wound.  You notice a change in the color of your skin near your wound.  You need to change the dressing frequently due to fluid, blood, or pus draining from the wound.  You develop a new rash.  You develop numbness around the wound. SEEK IMMEDIATE MEDICAL CARE IF:  You develop severe swelling around the wound.  Your pain suddenly increases and is severe.  You develop painful lumps near the wound or on skin that is anywhere on your body.  You have a red streak going away from your wound.  The wound is on your hand or foot and you cannot properly move a finger or toe.  The wound is on your hand or foot and you notice that your fingers or toes look pale or bluish.   This information is not intended to replace advice given to you by your health care provider. Make sure you discuss any questions you have with your health care provider.   Document Released: 11/18/2005 Document Revised: 04/04/2015 Document Reviewed: 11/14/2014 Elsevier Interactive Patient Education [employer]

## 2016-05-01 NOTE — ED Notes (Signed)
Patient transported to Ct. 

## 2016-05-01 NOTE — ED Provider Notes (Signed)
[facility] Emergency Department Provider Note  Time seen: 11:25 AM  I have reviewed the triage vital signs and the nursing notes.   HISTORY  Chief Complaint Head Laceration and Fall    HPI Marie Hicks is a 82 y.o. female with a past medical history of dementia, history of falls, hypertension, anxiety, presents the emergency department after a fall. Per report the patient was eating, she stood up and stumbled backwards following hitting the back of her head. No reported LOC. No nausea or vomiting. Patient states mild head pain but denies any other symptoms at this time.     Past Medical History  Diagnosis Date  . Breast cancer ([place])   . Gout   . A-fib ([place])   . Arthritis   . Hypertension   . Shortness of breath dyspnea     with exertion  . Dementia     son and daughter are POA  . Anxiety   . HOH (hard of hearing)   . Vertigo     past hx    Patient Active Problem List   Diagnosis Date Noted  . Breast cancer ([place]) 04/15/2016  . Dementia 06/09/2014  . Benign essential HTN 03/08/2014  . LBP (low back pain) 03/08/2014  . HLD (hyperlipidemia) 03/08/2014  . Diabetes mellitus ([place]) 03/08/2014    Past Surgical History  Procedure Laterality Date  . Cholecystectomy    . Photocoagulation with laser Right 09/11/2015    Procedure: PHOTOCOAGULATION WITH LASER;  Surgeon: Ronnell Freshwater, MD;  Location: [place];  Service: Ophthalmology;  Laterality: Right;  NEEDS REGULAR LASER PROBE NOT MICROPULSE  . Breast lumpectomy Left 2013    lumpectomy    Current Outpatient Rx  Name  Route  Sig  Dispense  Refill  . allopurinol (ZYLOPRIM) 300 MG tablet   Oral   Take 300 mg by mouth daily.         . bimatoprost (LUMIGAN) 0.01 % SOLN   Right Eye   Place 1 drop into the right eye at bedtime.         . Brinzolamide-Brimonidine (SIMBRINZA) 1-0.2 % SUSP   Right Eye   Place 1 drop into the right eye 3 (three) times daily.         .  Calcium-Vitamin D-Vitamin K 500-100-40 MG-UNT-MCG CHEW   Oral   Chew 1 capsule by mouth 2 (two) times daily.         . citalopram (CELEXA) 10 MG tablet   Oral   Take 10 mg by mouth daily.         . hydroxypropyl methylcellulose / hypromellose (ISOPTO TEARS / GONIOVISC) 2.5 % ophthalmic solution   Both Eyes   Place 1 drop into both eyes 4 (four) times daily.         Marland Kitchen letrozole (FEMARA) 2.5 MG tablet   Oral   Take 1 tablet (2.5 mg total) by mouth daily.   30 tablet   6   . lisinopril (PRINIVIL,ZESTRIL) 10 MG tablet   Oral   Take 10 mg by mouth daily.          . Multiple Vitamin (MULTI-VITAMINS) TABS   Oral   Take 1 tablet by mouth daily.          Marland Kitchen nystatin (MYCOSTATIN) powder   Topical   Apply 1 Dose topically daily as needed.         . risperiDONE (RISPERDAL) 1 MG tablet   Oral  Take 1 mg by mouth at bedtime.         . traZODone (DESYREL) 100 MG tablet   Oral   Take 100 mg by mouth at bedtime.         . triamcinolone cream (KENALOG) 0.1 %   Topical   Apply 1 application topically 2 (two) times daily as needed (for rash/irritation on stomach folds).            Allergies Aliskiren; Donepezil hcl; Exelon; and Penicillin g potassium in d5w  No family history on file.  Social History Social History  Substance Use Topics  . Smoking status: Never Smoker   . Smokeless tobacco: Never Used  . Alcohol Use: No    Review of Systems Constitutional: Negative for fever. Cardiovascular: Negative for chest pain. Respiratory: Negative for shortness of breath. Gastrointestinal: Negative for abdominal pain Musculoskeletal: Negative for back pain. Neurological: Mild occipital head pain. Denies focal weakness or numbness. 10-point ROS otherwise negative.  ____________________________________________   PHYSICAL EXAM:  VITAL SIGNS: ED Triage Vitals  Enc Vitals Group     BP 05/01/16 1049 153/64 mmHg     Pulse Rate 05/01/16 1049 82     Resp  05/01/16 1049 18     Temp 05/01/16 1049 97.6 F (36.4 C)     Temp src --      SpO2 05/01/16 1049 98 %     Weight 05/01/16 1049 218 lb (98.884 kg)     Height 05/01/16 1049 5\' 3"  (1.6 m)     Head Cir --      Peak Flow --      Pain Score 05/01/16 1050 0     Pain Loc --      Pain Edu? --      Excl. in [place]? --     Constitutional: Alert. Acting at baseline per family. Well appearing and in no distress. Eyes: Normal exam ENT   Head: 3 cm laceration to occipital scalp. Mild oozing. No cervical spine tenderness to palpation.   Nose: No congestion/rhinnorhea.   Mouth/Throat: Mucous membranes are moist. Cardiovascular: Normal rate, regular rhythm.  Respiratory: Normal respiratory effort without tachypnea nor retractions. Breath sounds are clear  Gastrointestinal: Soft and nontender. No distention.   Musculoskeletal: Good range of motion in all extremities. No tenderness or pain with range of motion. Atraumatic appearing extremities. Stable pelvis. Neurologic:  Normal speech and language. No gross focal neurologic deficits Skin:  Skin is warm, dry. Laceration as above. Psychiatric: Mood and affect are normal. Speech and behavior are normal.   ____________________________________________   RADIOLOGY  CT head and neck negative for acute abnormality.  ____________________________________________    INITIAL IMPRESSION / ASSESSMENT AND PLAN / ED COURSE  Pertinent labs & imaging results that were available during my care of the patient were reviewed by me and considered in my medical decision making (see chart for details).  The patient presents the emergency department after a fall from standing height. 3 cm occipital scalp laceration which will require repair with staples. Patient has mild head pain, denies any other pain. Normal physical exam with good range of motion in all joints and extremities. No back or neck tenderness. We will obtain a CT head/C-spine, and repair  laceration. Family is here with patient.  CT head and neck negative. Repaired laceration with staples. We will discharge the patient home after ambulation.   LACERATION REPAIR Performed by: Harvest Dark Authorized by: Harvest Dark Consent: Verbal consent obtained. Risks and benefits:  risks, benefits and alternatives were discussed Consent given by: patient Patient identity confirmed: provided demographic data Prepped and Draped in normal sterile fashion Wound explored  Laceration Location: Occipital scalp  Laceration Length: 3 cm  No Foreign Bodies seen or palpated  Anesthesia: local infiltration  Local anesthetic: lidocaine 1 % w/ epinephrine  Anesthetic total: 5 ml  Irrigation method: syringe Amount of cleaning: standard  Skin closure: Staples   Number of staples: 3   Patient tolerance: Patient tolerated the procedure well with no immediate complications.   ____________________________________________   FINAL CLINICAL IMPRESSION(S) / ED DIAGNOSES  Laceration Cheral Marker, MD 05/01/16 1246

## 2016-06-07 ENCOUNTER — Emergency Department: Payer: Medicare Other

## 2016-06-07 ENCOUNTER — Inpatient Hospital Stay
Admission: EM | Admit: 2016-06-07 | Discharge: 2016-06-08 | DRG: 093 | Disposition: A | Discharge status: Home / Self Care | Payer: Medicare Other | Attending: Internal Medicine | Admitting: Internal Medicine

## 2016-06-07 ENCOUNTER — Encounter: Payer: Self-pay | Admitting: Emergency Medicine

## 2016-06-07 DIAGNOSIS — R748 Abnormal levels of other serum enzymes: Secondary | ICD-10-CM | POA: Diagnosis present

## 2016-06-07 DIAGNOSIS — I639 Cerebral infarction, unspecified: Secondary | ICD-10-CM

## 2016-06-07 DIAGNOSIS — R4701 Aphasia: Principal | ICD-10-CM | POA: Diagnosis present

## 2016-06-07 DIAGNOSIS — Z880 Allergy status to penicillin: Secondary | ICD-10-CM

## 2016-06-07 DIAGNOSIS — M1990 Unspecified osteoarthritis, unspecified site: Secondary | ICD-10-CM | POA: Diagnosis present

## 2016-06-07 DIAGNOSIS — Z853 Personal history of malignant neoplasm of breast: Secondary | ICD-10-CM

## 2016-06-07 DIAGNOSIS — F0390 Unspecified dementia without behavioral disturbance: Secondary | ICD-10-CM | POA: Diagnosis present

## 2016-06-07 DIAGNOSIS — I482 Chronic atrial fibrillation: Secondary | ICD-10-CM | POA: Diagnosis present

## 2016-06-07 DIAGNOSIS — F419 Anxiety disorder, unspecified: Secondary | ICD-10-CM | POA: Diagnosis present

## 2016-06-07 DIAGNOSIS — M109 Gout, unspecified: Secondary | ICD-10-CM | POA: Diagnosis present

## 2016-06-07 DIAGNOSIS — Z888 Allergy status to other drugs, medicaments and biological substances status: Secondary | ICD-10-CM | POA: Diagnosis not present

## 2016-06-07 DIAGNOSIS — H9190 Unspecified hearing loss, unspecified ear: Secondary | ICD-10-CM | POA: Diagnosis present

## 2016-06-07 DIAGNOSIS — I10 Essential (primary) hypertension: Secondary | ICD-10-CM | POA: Diagnosis present

## 2016-06-07 LAB — COMPREHENSIVE METABOLIC PANEL
ALT: 15 U/L (ref 14–54)
AST: 47 U/L — AB (ref 15–41)
Albumin: 3.7 g/dL (ref 3.5–5.0)
Alkaline Phosphatase: 63 U/L (ref 38–126)
Anion gap: 10 (ref 5–15)
BUN: 35 mg/dL — AB (ref 6–20)
CHLORIDE: 101 mmol/L (ref 101–111)
CO2: 29 mmol/L (ref 22–32)
CREATININE: 1.34 mg/dL — AB (ref 0.44–1.00)
Calcium: 10.3 mg/dL (ref 8.9–10.3)
GFR calc Af Amer: 41 mL/min — ABNORMAL LOW (ref >60)
GFR calc non Af Amer: 35 mL/min — ABNORMAL LOW (ref >60)
Glucose, Bld: 102 mg/dL — ABNORMAL HIGH (ref 65–99)
Potassium: 4.5 mmol/L (ref 3.5–5.1)
SODIUM: 140 mmol/L (ref 135–145)
Total Bilirubin: 1.5 mg/dL — ABNORMAL HIGH (ref 0.3–1.2)
Total Protein: 6.7 g/dL (ref 6.5–8.1)

## 2016-06-07 LAB — URINALYSIS COMPLETE WITH MICROSCOPIC (ARMC ONLY)
BACTERIA UA: NONE SEEN
Bilirubin Urine: NEGATIVE
Glucose, UA: NEGATIVE mg/dL
Hgb urine dipstick: NEGATIVE
Nitrite: NEGATIVE
PH: 5 (ref 5.0–8.0)
PROTEIN: 30 mg/dL — AB
Specific Gravity, Urine: 1.023 (ref 1.005–1.030)
Squamous Epithelial / LPF: NONE SEEN

## 2016-06-07 LAB — CBC WITH DIFFERENTIAL/PLATELET
BASOS PCT: 0 %
Basophils Absolute: 0 10*3/uL (ref 0–0.1)
EOS ABS: 0.2 10*3/uL (ref 0–0.7)
EOS PCT: 2 %
HCT: 36.7 % (ref 35.0–47.0)
HEMOGLOBIN: 11.8 g/dL — AB (ref 12.0–16.0)
Lymphocytes Relative: 23 %
Lymphs Abs: 1.8 10*3/uL (ref 1.0–3.6)
MCH: 29.1 pg (ref 26.0–34.0)
MCHC: 32.1 g/dL (ref 32.0–36.0)
MCV: 90.8 fL (ref 80.0–100.0)
MONOS PCT: 8 %
Monocytes Absolute: 0.7 10*3/uL (ref 0.2–0.9)
NEUTROS PCT: 67 %
Neutro Abs: 5.3 10*3/uL (ref 1.4–6.5)
PLATELETS: 110 10*3/uL — AB (ref 150–440)
RBC: 4.05 MIL/uL (ref 3.80–5.20)
RDW: 15.7 % — ABNORMAL HIGH (ref 11.5–14.5)
WBC: 7.9 10*3/uL (ref 3.6–11.0)

## 2016-06-07 LAB — AMMONIA: AMMONIA: 10 umol/L (ref 9–35)

## 2016-06-07 LAB — GLUCOSE, CAPILLARY: GLUCOSE-CAPILLARY: 92 mg/dL (ref 65–99)

## 2016-06-07 LAB — PROTIME-INR
INR: 2.4
Prothrombin Time: 25.9 seconds — ABNORMAL HIGH (ref 11.4–15.0)

## 2016-06-07 LAB — TROPONIN I: Troponin I: 0.06 ng/mL (ref <0.03)

## 2016-06-07 LAB — APTT: APTT: 33 s (ref 24–36)

## 2016-06-07 LAB — VALPROIC ACID LEVEL: Valproic Acid Lvl: 28 ug/mL — ABNORMAL LOW (ref 50.0–100.0)

## 2016-06-07 MED ORDER — LATANOPROST 0.005 % OP SOLN
1.0000 [drp] | Freq: Every day | OPHTHALMIC | Status: DC
  Administered 2016-06-07: 23:00:00 1 [drp] via OPHTHALMIC
  Filled 2016-06-07: qty 2.5

## 2016-06-07 MED ORDER — ADULT MULTIVITAMIN W/MINERALS CH: 1.0000 | ORAL_TABLET | Freq: Every day | ORAL | Status: DC

## 2016-06-07 MED ORDER — ACETAMINOPHEN 650 MG RE SUPP: 650.0000 mg | Freq: Four times a day (QID) | RECTAL | Status: DC | PRN

## 2016-06-07 MED ORDER — NYSTATIN 100000 UNIT/GM EX POWD
Freq: Three times a day (TID) | CUTANEOUS | Status: DC
  Administered 2016-06-07 – 2016-06-08 (×2): via TOPICAL
  Filled 2016-06-07: qty 15

## 2016-06-07 MED ORDER — ENOXAPARIN SODIUM 40 MG/0.4ML ~~LOC~~ SOLN
40.0000 mg | Freq: Every day | SUBCUTANEOUS | Status: DC
  Administered 2016-06-07: 40 mg via SUBCUTANEOUS
  Filled 2016-06-07: qty 0.4

## 2016-06-07 MED ORDER — ONDANSETRON HCL 4 MG/2ML IJ SOLN: 4.0000 mg | Freq: Four times a day (QID) | INTRAMUSCULAR | Status: DC | PRN

## 2016-06-07 MED ORDER — LETROZOLE 2.5 MG PO TABS
2.5000 mg | ORAL_TABLET | Freq: Every day | ORAL | Status: DC
  Administered 2016-06-08: 12:00:00 2.5 mg via ORAL
  Filled 2016-06-07: qty 1

## 2016-06-07 MED ORDER — SODIUM CHLORIDE 0.9 % IV BOLUS (SEPSIS)
500.0000 mL | Freq: Once | INTRAVENOUS | Status: AC
  Administered 2016-06-07: 500 mL via INTRAVENOUS

## 2016-06-07 MED ORDER — SODIUM CHLORIDE 0.9 % IV SOLN
INTRAVENOUS | Status: DC
  Administered 2016-06-07: 23:00:00 via INTRAVENOUS

## 2016-06-07 MED ORDER — ASPIRIN 81 MG PO CHEW
324.0000 mg | CHEWABLE_TABLET | Freq: Once | ORAL | Status: DC
  Filled 2016-06-07: qty 4

## 2016-06-07 MED ORDER — ASPIRIN 300 MG RE SUPP
300.0000 mg | Freq: Once | RECTAL | Status: AC
  Administered 2016-06-07: 300 mg via RECTAL

## 2016-06-07 MED ORDER — ONDANSETRON HCL 4 MG PO TABS: 4.0000 mg | ORAL_TABLET | Freq: Four times a day (QID) | ORAL | Status: DC | PRN

## 2016-06-07 MED ORDER — ASPIRIN 300 MG RE SUPP: 300.0000 mg | Freq: Every day | RECTAL | Status: DC

## 2016-06-07 MED ORDER — SODIUM CHLORIDE 0.9% FLUSH
3.0000 mL | Freq: Two times a day (BID) | INTRAVENOUS | Status: DC
  Administered 2016-06-07 – 2016-06-08 (×2): 3 mL via INTRAVENOUS

## 2016-06-07 MED ORDER — LISINOPRIL 10 MG PO TABS
10.0000 mg | ORAL_TABLET | Freq: Every day | ORAL | Status: DC
  Administered 2016-06-08: 12:00:00 10 mg via ORAL
  Filled 2016-06-07: qty 1

## 2016-06-07 MED ORDER — POLYVINYL ALCOHOL 1.4 % OP SOLN
1.0000 [drp] | Freq: Four times a day (QID) | OPHTHALMIC | Status: DC
  Administered 2016-06-07 – 2016-06-08 (×2): 1 [drp] via OPHTHALMIC
  Filled 2016-06-07: qty 15

## 2016-06-07 MED ORDER — STROKE: EARLY STAGES OF RECOVERY BOOK
Freq: Once | Status: AC
  Administered 2016-06-07: 23:00:00

## 2016-06-07 MED ORDER — ASPIRIN 325 MG PO TABS
325.0000 mg | ORAL_TABLET | Freq: Every day | ORAL | Status: DC
  Administered 2016-06-08: 12:00:00 325 mg via ORAL
  Filled 2016-06-07: qty 1

## 2016-06-07 MED ORDER — ACETAMINOPHEN 325 MG PO TABS
650.0000 mg | ORAL_TABLET | Freq: Four times a day (QID) | ORAL | Status: DC | PRN
Start: 2016-06-07 — End: 2016-06-08

## 2016-06-07 MED ORDER — CITALOPRAM HYDROBROMIDE 10 MG PO TABS
10.0000 mg | ORAL_TABLET | Freq: Every day | ORAL | Status: DC
  Filled 2016-06-07: qty 1

## 2016-06-07 MED ORDER — CALCIUM CITRATE-VITAMIN D 500-400 MG-UNIT PO CHEW
1.0000 | CHEWABLE_TABLET | Freq: Two times a day (BID) | ORAL | Status: DC
  Administered 2016-06-08: 12:00:00 1 via ORAL
  Filled 2016-06-07 (×2): qty 1

## 2016-06-07 MED ORDER — RISPERIDONE 0.5 MG PO TABS: 1.0000 mg | ORAL_TABLET | Freq: Every day | ORAL | Status: DC

## 2016-06-07 MED ORDER — ASPIRIN 300 MG RE SUPP
RECTAL | Status: AC
  Administered 2016-06-07: 300 mg via RECTAL
  Filled 2016-06-07: qty 1

## 2016-06-07 MED ORDER — TRAZODONE HCL 100 MG PO TABS: 100.0000 mg | ORAL_TABLET | Freq: Every day | ORAL | Status: DC

## 2016-06-07 MED ORDER — ALLOPURINOL 100 MG PO TABS
300.0000 mg | ORAL_TABLET | Freq: Every day | ORAL | Status: DC
  Administered 2016-06-08: 12:00:00 300 mg via ORAL
  Filled 2016-06-07: qty 3

## 2016-06-07 NOTE — H&P (Signed)
[facility] [place] NAME: Marie Hicks    MR#:  [id number]  DATE OF BIRTH:  December 06, 1928   DATE OF ADMISSION:  06/07/2016  PRIMARY CARE PHYSICIAN: BABAOFF, Caryl Bis, MD   REQUESTING/REFERRING PHYSICIAN: Marcelene Butte  CHIEF COMPLAINT:   Chief Complaint  Patient presents with  . Aphasia    HISTORY OF PRESENT ILLNESS:  Marie Hicks  is a 82 y.o. female with a known history of Baseline dementia, chronic atrial fibrillation presenting with altered mental status. Patient unable to provide any meaningful information given mental status. History obtained from son present at bedside. He states she has been in her usual state of health however this morning she was much less responsive and would not speak. She also refused to walk. At baseline she is confused but conversant she uses a walker. Code stroke initiated in emergency department  PAST MEDICAL HISTORY:   Past Medical History  Diagnosis Date  . Breast cancer ([place])   . Gout   . A-fib ([place])   . Arthritis   . Hypertension   . Shortness of breath dyspnea     with exertion  . Dementia     son and daughter are POA  . Anxiety   . HOH (hard of hearing)   . Vertigo     past hx    PAST SURGICAL HISTORY:   Past Surgical History  Procedure Laterality Date  . Cholecystectomy    . Photocoagulation with laser Right 09/11/2015    Procedure: PHOTOCOAGULATION WITH LASER;  Surgeon: Ronnell Freshwater, MD;  Location: [place];  Service: Ophthalmology;  Laterality: Right;  NEEDS REGULAR LASER PROBE NOT MICROPULSE  . Breast lumpectomy Left 2013    lumpectomy    SOCIAL HISTORY:   Social History  Substance Use Topics  . Smoking status: Never Smoker   . Smokeless tobacco: Never Used  . Alcohol Use: No    FAMILY HISTORY:   Family History  Problem Relation Age of Onset  . Hypertension Other     DRUG ALLERGIES:   Allergies  Allergen Reactions  . Aliskiren Other (See Comments)   cramps Other reaction(s): Other (See Comments) cramps  . Donepezil Hcl Nausea Only  . Exelon [Rivastigmine] Rash  . Penicillin G Potassium In D5w Rash    REVIEW OF SYSTEMS:  Unable to obtain given patient's mental status   MEDICATIONS AT HOME:   Prior to Admission medications   Medication Sig Start Date End Date Taking? Authorizing Provider  allopurinol (ZYLOPRIM) 300 MG tablet Take 300 mg by mouth daily.    Historical Provider, MD  bimatoprost (LUMIGAN) 0.01 % SOLN Place 1 drop into the right eye at bedtime.    Historical Provider, MD  Brinzolamide-Brimonidine [facility]) 1-0.2 % SUSP Place 1 drop into the right eye 3 (three) times daily.    Historical Provider, MD  Calcium-Vitamin D-Vitamin K 500-100-40 MG-UNT-MCG CHEW Chew 1 capsule by mouth 2 (two) times daily.    Historical Provider, MD  citalopram (CELEXA) 10 MG tablet Take 10 mg by mouth daily.    Historical Provider, MD  hydroxypropyl methylcellulose / hypromellose (ISOPTO TEARS / GONIOVISC) 2.5 % ophthalmic solution Place 1 drop into both eyes 4 (four) times daily.    Historical Provider, MD  letrozole (FEMARA) 2.5 MG tablet Take 1 tablet (2.5 mg total) by mouth daily. 09/20/15   Forest Gleason, MD  lisinopril (PRINIVIL,ZESTRIL) 10 MG tablet Take 10 mg by mouth daily.  Historical Provider, MD  Multiple Vitamin (MULTI-VITAMINS) TABS Take 1 tablet by mouth daily.     Historical Provider, MD  nystatin (MYCOSTATIN) powder Apply 1 Dose topically daily as needed. 03/28/16 03/28/17  Historical Provider, MD  risperiDONE (RISPERDAL) 1 MG tablet Take 1 mg by mouth at bedtime.    Historical Provider, MD  traZODone (DESYREL) 100 MG tablet Take 100 mg by mouth at bedtime.    Historical Provider, MD  triamcinolone cream (KENALOG) 0.1 % Apply 1 application topically 2 (two) times daily as needed (for rash/irritation on stomach folds).     Historical Provider, MD      VITAL SIGNS:  Blood pressure 157/78, pulse 82, resp. rate 19, SpO2 98  %.  PHYSICAL EXAMINATION:   VITAL SIGNS: Filed Vitals:   06/07/16 1830 06/07/16 1900  BP: 157/78   Pulse: 81 82  Resp: 23 19   GENERAL:82 y.o.female moderate distress given mental status.  HEAD: Normocephalic, atraumatic.  EYES: Pupils equal, round, reactive to light. Unable to assess extraocular muscles given mental status/medical condition. No scleral icterus.  MOUTH: Moist mucosal membrane. Dentition intact. No abscess noted.  EAR, NOSE, THROAT: Clear without exudates. No external lesions.  NECK: Supple. No thyromegaly. No nodules. No JVD.  PULMONARY: Clear to ascultation, without wheeze rails or rhonci. No use of accessory muscles, Good respiratory effort. good air entry bilaterally CHEST: Nontender to palpation.  CARDIOVASCULAR: S1 and S2. Regular rate and rhythm. No murmurs, rubs, or gallops. No edema. Pedal pulses 2+ bilaterally.  GASTROINTESTINAL: Soft, nontender, nondistended. No masses. Positive bowel sounds. No hepatosplenomegaly.  MUSCULOSKELETAL: No swelling, clubbing, or edema. Range of motion full in all extremities.  NEUROLOGIC:Difficult to fully assess given mental status/medical condition with great prompting she is able to answer yes/no but has difficulty following basic commands, strength seems to be 4/5 throughout but movements are extremely slow and difficult to elicit SKIN: No ulceration, lesions, rashes, or cyanosis. Skin warm and dry. Turgor intact.  PSYCHIATRIC: Unable to assess given mental status/medical condition     LABORATORY PANEL:   CBC  Recent Labs Lab 06/07/16 1900  WBC 7.9  HGB 11.8*  HCT 36.7  PLT 110*   ------------------------------------------------------------------------------------------------------------------  Chemistries   Recent Labs Lab 06/07/16 1810  NA 140  K 4.5  CL 101  CO2 29  GLUCOSE 102*  BUN 35*  CREATININE 1.34*  CALCIUM 10.3  AST 47*  ALT 15  ALKPHOS 63  BILITOT 1.5*    ------------------------------------------------------------------------------------------------------------------  Cardiac Enzymes  Recent Labs Lab 06/07/16 1810  TROPONINI 0.06*   ------------------------------------------------------------------------------------------------------------------  RADIOLOGY:  Ct Head Wo Contrast  06/07/2016  CLINICAL DATA:  Code stroke EXAM: CT HEAD WITHOUT CONTRAST TECHNIQUE: Contiguous axial images were obtained from the base of the skull through the vertex without intravenous contrast. COMPARISON:  05/01/2016 FINDINGS: No evidence of parenchymal hemorrhage or extra-axial fluid collection. No mass lesion, mass effect, or midline shift. No CT evidence of acute infarction. Subcortical white matter and periventricular small vessel ischemic changes. Intracranial atherosclerosis. Global cortical and central atrophy. Secondary mild ventricular prominence. The visualized paranasal sinuses are essentially clear. The mastoid air cells are unopacified. No evidence of calvarial fracture. IMPRESSION: No evidence of acute intracranial abnormality. Atrophy with small vessel ischemic changes. These results were called by telephone at the time of interpretation on 06/07/2016 at 6:01 pm to Dr. Marcelene Butte, who verbally acknowledged these results. Electronically Signed   By: Julian Hy M.D.   On: 06/07/2016 18:03    EKG:  Orders placed or performed during the hospital encounter of 06/07/16  . ED EKG  . ED EKG  . EKG 12-Lead  . EKG 12-Lead    IMPRESSION AND PLAN:   82 year old Caucasian female history of dementia, essential hypertension presenting with altered mental status  1.CVA, unspecified: Initiate aspirin/statin therapy, stroke protocol/precautions, place on telemetry, trend cardiac enzymes, check MRI brain, complete echocardiogram, lipid panel, hemoglobin A1c, bilateral carotid Doppler, permissive hypertension first 24 hours treating blood pressure only  greater than 220/120, continue rule out other etiologies for altered mental status-patient does have A. fib apparently on no anticoagulation per her son and medication list, check INR 2. Essential hypertension: Permissive hypertension as stated above 3. Elevated troponin: Aspirin, trend cardiac enzymes, telemetry would avoid full anticoagulation regardless given potential for stroke 4. Atrial fibrillation rate controlled on no anticoagulation per family check INR     All the records are reviewed and case discussed with ED provider. Management plans discussed with the patient, family and they are in agreement.  CODE STATUS: DO NOT RESUSCITATE  TOTAL TIME TAKING CARE OF THIS PATIENT: 33 minutes.    Maripat Borba,  Karenann Cai.D on 06/07/2016 at 8:11 PM  Between 7am to 6pm - Pager - [phone]  After 6pm: House Pager: [phone]  [facility]  Office  [phone]  CC: Primary care physician; BABAOFF, Caryl Bis, MD

## 2016-06-07 NOTE — ED Notes (Signed)
Son reports pt stopped talking around 1400. Pt alert but not following directions.

## 2016-06-07 NOTE — ED Provider Notes (Signed)
Time Seen: Approximately 1835 I have reviewed the triage notes  Chief Complaint: Aphasia   History of Present Illness: Marie Hicks is a 82 y.o. female who presents with acute onset of a fascia. The patient was apparently fine yesterday and was noticed to be a phasic at 1400 today. No focal weakness in either upper or lower extremities. She does continue to follow commands. The patient's not had any history of a cerebrovascular accident though a history of atrial fibrillation and dementia. She is not currently on any significant blood thinner. Patient denies any headache. She is currently nonverbal with no facial weakness   Past Medical History  Diagnosis Date  . Breast cancer ([place])   . Gout   . A-fib ([place])   . Arthritis   . Hypertension   . Shortness of breath dyspnea     with exertion  . Dementia     son and daughter are POA  . Anxiety   . HOH (hard of hearing)   . Vertigo     past hx    Patient Active Problem List   Diagnosis Date Noted  . CVA (cerebral infarction) 06/07/2016  . Breast cancer ([place]) 04/15/2016  . Dementia 06/09/2014  . Benign essential HTN 03/08/2014  . LBP (low back pain) 03/08/2014  . HLD (hyperlipidemia) 03/08/2014  . Diabetes mellitus ([place]) 03/08/2014    Past Surgical History  Procedure Laterality Date  . Cholecystectomy    . Photocoagulation with laser Right 09/11/2015    Procedure: PHOTOCOAGULATION WITH LASER;  Surgeon: Ronnell Freshwater, MD;  Location: [facility];  Service: Ophthalmology;  Laterality: Right;  NEEDS REGULAR LASER PROBE NOT MICROPULSE  . Breast lumpectomy Left 2013    lumpectomy    Past Surgical History  Procedure Laterality Date  . Cholecystectomy    . Photocoagulation with laser Right 09/11/2015    Procedure: PHOTOCOAGULATION WITH LASER;  Surgeon: Ronnell Freshwater, MD;  Location: [facility];  Service: Ophthalmology;  Laterality: Right;  NEEDS REGULAR LASER PROBE NOT MICROPULSE  . Breast  lumpectomy Left 2013    lumpectomy    No current outpatient prescriptions on file.  Allergies:  Aliskiren; Donepezil hcl; Exelon; and Penicillin g potassium in d5w  Family History: Family History  Problem Relation Age of Onset  . Hypertension Other     Social History: Social History  Substance Use Topics  . Smoking status: Never Smoker   . Smokeless tobacco: Never Used  . Alcohol Use: No     Review of Systems:   10 point review of systems was performed and was otherwise negative:  Constitutional: No fever Eyes: No visual disturbances ENT: No sore throat, ear pain Cardiac: No chest pain Respiratory: No shortness of breath, wheezing, or stridor Abdomen: No abdominal pain, no vomiting, No diarrhea Endocrine: No weight loss, No night sweats Extremities: No peripheral edema, cyanosis Skin: No rashes, easy bruising Neurologic: No focal weakness,  Urologic: No dysuria, Hematuria, or urinary frequency   Physical Exam:  ED Triage Vitals  Enc Vitals Group     BP 06/07/16 1828 160/73 mmHg     Pulse Rate 06/07/16 1830 81     Resp 06/07/16 1828 21     Temp 06/07/16 1830 97.8 F (36.6 C)     Temp Source 06/07/16 2040 Oral     SpO2 06/07/16 1830 100 %     Weight --      Height --      Head Cir --  Peak Flow --      Pain Score 06/07/16 1747 0     Pain Loc --      Pain Edu? --      Excl. in [place]? --     General: Awake , Alert ,Orientation difficult to assess since patient is nonverbal during initial exam Head: Normal cephalic , atraumatic Eyes: Pupils equal , round, reactive to light Nose/Throat: No nasal drainage, patent upper airway without erythema or exudate.  Neck: Supple, Full range of motion, No anterior adenopathy or palpable thyroid masses Lungs: Clear to ascultation without wheezes , rhonchi, or rales Heart: Regular rate, regular rhythm without murmurs , gallops , or rubs Abdomen: Soft, non tender without rebound, guarding , or rigidity; bowel sounds  positive and symmetric in all 4 quadrants. No organomegaly .        Extremities: 2 plus symmetric pulses. No edema, clubbing or cyanosis Neurologic: , Motor symmetric without deficits, sensory intact Skin: warm, dry, no rashes   Labs:   All laboratory work was reviewed including any pertinent negatives or positives listed below:  Labs Reviewed  PROTIME-INR - Abnormal; Notable for the following:    Prothrombin Time 25.9 (*)    All other components within normal limits  COMPREHENSIVE METABOLIC PANEL - Abnormal; Notable for the following:    Glucose, Bld 102 (*)    BUN 35 (*)    Creatinine, Ser 1.34 (*)    AST 47 (*)    Total Bilirubin 1.5 (*)    GFR calc non Af Amer 35 (*)    GFR calc Af Amer 41 (*)    All other components within normal limits  URINALYSIS COMPLETEWITH MICROSCOPIC ([facility] ONLY) - Abnormal; Notable for the following:    Color, Urine AMBER (*)    APPearance CLEAR (*)    Ketones, ur TRACE (*)    Protein, ur 30 (*)    Leukocytes, UA 1+ (*)    All other components within normal limits  TROPONIN I - Abnormal; Notable for the following:    Troponin I 0.06 (*)    All other components within normal limits  CBC WITH DIFFERENTIAL/PLATELET - Abnormal; Notable for the following:    Hemoglobin 11.8 (*)    RDW 15.7 (*)    Platelets 110 (*)    All other components within normal limits  VALPROIC ACID LEVEL - Abnormal; Notable for the following:    Valproic Acid Lvl 28 (*)    All other components within normal limits  MRSA PCR SCREENING  APTT  GLUCOSE, CAPILLARY  AMMONIA  HEMOGLOBIN A1C  LIPID PANEL  TROPONIN I  TROPONIN I  TROPONIN I    EKG:  ED ECG REPORT I, Daymon Larsen, the attending physician, personally viewed and interpreted this ECG.  Date: 06/07/2016 EKG Time: 1811 Rate: 78 Rhythm: Atrial fibrillation with right bundle branch block QRS Axis: normal Intervals: normal ST/T Wave abnormalities: normal Conduction Disturbances: none Narrative  Interpretation: unremarkable *No acute ischemic changes   Radiology: *     CT Head Wo Contrast (Final result) Result time: 06/07/16 18:03:33   Final result by Rad Results In Interface (06/07/16 18:03:33)   Narrative:   CLINICAL DATA: Code stroke  EXAM: CT HEAD WITHOUT CONTRAST  TECHNIQUE: Contiguous axial images were obtained from the base of the skull through the vertex without intravenous contrast.  COMPARISON: 05/01/2016  FINDINGS: No evidence of parenchymal hemorrhage or extra-axial fluid collection. No mass lesion, mass effect, or midline shift.  No CT  evidence of acute infarction.  Subcortical white matter and periventricular small vessel ischemic changes. Intracranial atherosclerosis.  Global cortical and central atrophy. Secondary mild ventricular prominence.  The visualized paranasal sinuses are essentially clear. The mastoid air cells are unopacified.  No evidence of calvarial fracture.  IMPRESSION: No evidence of acute intracranial abnormality.  Atrophy with small vessel ischemic changes.  These results were called by telephone at the time of interpretation on 06/07/2016 at 6:01 pm to Dr. Marcelene Butte, who verbally acknowledged these results.   Electronically Signed By: Julian Hy M.D. On: 06/07/2016 18:03         I personally reviewed the radiologic studies   Critical Care: * CRITICAL CARE Performed by: Daymon Larsen   Total critical care time: 37 minutes  Critical care time was exclusive of separately billable procedures and treating other patients.  Critical care was necessary to treat or prevent imminent or life-threatening deterioration.  Critical care was time spent personally by me on the following activities: development of treatment plan with patient and/or surrogate as well as nursing, discussions with consultants, evaluation of patient's response to treatment, examination of patient, obtaining history from patient  or surrogate, ordering and performing treatments and interventions, ordering and review of laboratory studies, ordering and review of radiographic studies, pulse oximetry and re-evaluation of patient's condition. Initial evaluation and assessment for code stroke with appropriate consultation, etc.    ED Course:  Patient's stay here showed some improvement in her speech and she is able to her knowledge her family. She does not appear to have any focal neurologic deficits other than her a fascia with no facial weakness or extremity weakness etc. Patient was seen by [facility] neurology. All parties feel the patient is not a TPA candidate especially with unknown initiation of his symptoms. The patient was also worked up for general encephalopathy and had a evaluation of valproic and ammonia levels. Patient's case was discussed with the hospitalist team, further disposition and management depends upon her evaluation.    Assessment:  Cerebrovascular accident Atrial fibrillation   Final Clinical Impression:   Final diagnoses:  CVA (cerebral infarction)  CVA (cerebral infarction)     Plan: * Inpatient           Daymon Larsen, MD 06/07/16 2148

## 2016-06-07 NOTE — ED Notes (Signed)
Called code stroke on 3333, pt transported to CT

## 2016-06-08 ENCOUNTER — Inpatient Hospital Stay
Admit: 2016-06-08 | Discharge: 2016-06-08 | Disposition: A | Discharge status: Home / Self Care | Payer: Medicare Other | Attending: Internal Medicine | Admitting: Internal Medicine

## 2016-06-08 ENCOUNTER — Inpatient Hospital Stay: Payer: Medicare Other

## 2016-06-08 LAB — ECHOCARDIOGRAM COMPLETE
Height: 64 in
Weight: 2723.2 oz

## 2016-06-08 LAB — HEMOGLOBIN A1C: HEMOGLOBIN A1C: 5.7 % (ref 4.0–6.0)

## 2016-06-08 LAB — MRSA PCR SCREENING: MRSA BY PCR: NEGATIVE

## 2016-06-08 LAB — LIPID PANEL
CHOLESTEROL: 113 mg/dL (ref 0–200)
HDL: 34 mg/dL — AB (ref >40)
LDL Cholesterol: 65 mg/dL (ref 0–99)
TRIGLYCERIDES: 70 mg/dL (ref <150)
Total CHOL/HDL Ratio: 3.3 RATIO
VLDL: 14 mg/dL (ref 0–40)

## 2016-06-08 LAB — TROPONIN I
TROPONIN I: 0.03 ng/mL — AB (ref <0.03)
TROPONIN I: 0.04 ng/mL — AB (ref <0.03)

## 2016-06-08 MED ORDER — LORAZEPAM 2 MG/ML IJ SOLN
0.5000 mg | Freq: Once | INTRAMUSCULAR | Status: DC
  Filled 2016-06-08: qty 1

## 2016-06-08 MED ORDER — LORAZEPAM 2 MG/ML IJ SOLN: 0.5000 mg | Freq: Once | INTRAMUSCULAR | Status: DC

## 2016-06-08 NOTE — Discharge Summary (Addendum)
[facility] [place] NAME: Marie Hicks    MR#:  [id number]  DATE OF BIRTH:  02-19-1929  DATE OF ADMISSION:  06/07/2016 ADMITTING PHYSICIAN: Lytle Butte, MD  DATE OF DISCHARGE: 06/08/2016  PRIMARY CARE PHYSICIAN: BABAOFF, Caryl Bis, MD    ADMISSION DIAGNOSIS:  stroke  DISCHARGE DIAGNOSIS:  Active Problems:   Aphasia   SECONDARY DIAGNOSIS:   Past Medical History  Diagnosis Date  . Breast cancer ([place])   . Gout   . A-fib ([place])   . Arthritis   . Hypertension   . Shortness of breath dyspnea     with exertion  . Dementia     son and daughter are POA  . Anxiety   . HOH (hard of hearing)   . Vertigo     past hx    HOSPITAL COURSE:   82 year old female with a history of dementia presents with aphasia  1. Aphasia: It appears the patient's symptoms are more related to dehydration. After IV fluids her symptoms resolved. She had no other neurological deficits. Carotid Doppler showed no hemodynamically significant stenosis. MRI showed no acute CVA. She has had glaucoma surgery int he past that correlates with the MRI finding of right eye.   2. Essential hypertension: Continue lisinopril  3. Dementia: Continue citalopram and Risperdal.  4. History of gout: Continue allopurinol  5. History of atrial fibrillation: . She is not on anticoagulation due to fall risk and dementia. Heart rate controlled  6. Elevated troponin: very minimal and insignificant. NOT ACS.   DISCHARGE CONDITIONS AND DIET:  AL Diet: dysphagia 2   CONSULTS OBTAINED:  Treatment Team:  Lytle Butte, MD  DRUG ALLERGIES:   Allergies  Allergen Reactions  . Aliskiren Other (See Comments)    cramps Other reaction(s): Other (See Comments) cramps  . Donepezil Hcl Nausea Only  . Exelon [Rivastigmine] Rash  . Penicillin G Potassium In D5w Rash    DISCHARGE MEDICATIONS:   Current Discharge Medication List    CONTINUE these medications which have NOT CHANGED    Details  divalproex (DEPAKOTE ER) 250 MG 24 hr tablet Take 250 mg by mouth daily.    ferrous sulfate 325 (65 FE) MG tablet Take 325 mg by mouth daily with breakfast.    allopurinol (ZYLOPRIM) 300 MG tablet Take 300 mg by mouth daily.    bimatoprost (LUMIGAN) 0.01 % SOLN Place 1 drop into the right eye at bedtime.    Brinzolamide-Brimonidine (SIMBRINZA) 1-0.2 % SUSP Place 1 drop into the right eye 3 (three) times daily.    Calcium-Vitamin D-Vitamin K 500-100-40 MG-UNT-MCG CHEW Chew 1 capsule by mouth 2 (two) times daily.    citalopram (CELEXA) 10 MG tablet Take 10 mg by mouth daily.    hydroxypropyl methylcellulose / hypromellose (ISOPTO TEARS / GONIOVISC) 2.5 % ophthalmic solution Place 1 drop into both eyes 4 (four) times daily.    letrozole (FEMARA) 2.5 MG tablet Take 1 tablet (2.5 mg total) by mouth daily. Qty: 30 tablet, Refills: 6   Associated Diagnoses: Malignant neoplasm of left female breast, unspecified site of breast (HCC)    lisinopril (PRINIVIL,ZESTRIL) 10 MG tablet Take 10 mg by mouth daily.     Multiple Vitamin (MULTI-VITAMINS) TABS Take 1 tablet by mouth daily.     nystatin (MYCOSTATIN) powder Apply 1 Dose topically daily as needed.    risperiDONE (RISPERDAL) 1 MG tablet Take 1 mg by mouth at bedtime.    traZODone (DESYREL)  100 MG tablet Take 100 mg by mouth at bedtime.    triamcinolone cream (KENALOG) 0.1 % Apply 1 application topically 2 (two) times daily as needed (for rash/irritation on stomach folds).               Today   CHIEF COMPLAINT:   No acute events   VITAL SIGNS:  Blood pressure 165/90, pulse 75, temperature 97.6 F (36.4 C), temperature source Oral, resp. rate 16, height 5\' 4"  (1.626 m), weight 77.202 kg (170 lb 3.2 oz), SpO2 98 %.   REVIEW OF SYSTEMS:  Review of Systems  Constitutional: Negative for fever, chills and malaise/fatigue.  HENT: Negative for ear discharge, ear pain, hearing loss, nosebleeds and sore throat.    Eyes: Negative for blurred vision and pain.  Respiratory: Negative for cough, hemoptysis, shortness of breath and wheezing.   Cardiovascular: Negative for chest pain, palpitations and leg swelling.  Gastrointestinal: Negative for nausea, vomiting, abdominal pain, diarrhea and blood in stool.  Genitourinary: Negative for dysuria.  Musculoskeletal: Negative for back pain.  Neurological: Negative for dizziness, tremors, speech change, focal weakness, seizures and headaches.  Endo/Heme/Allergies: Does not bruise/bleed easily.  Psychiatric/Behavioral: Positive for memory loss. Negative for depression, suicidal ideas and hallucinations.     PHYSICAL EXAMINATION:  GENERAL:  82 y.o.-year-old patient lying in the bed with no acute distress.  NECK:  Supple, no jugular venous distention. No thyroid enlargement, no tenderness.  LUNGS: Normal breath sounds bilaterally, no wheezing, rales,rhonchi  No use of accessory muscles of respiration.  CARDIOVASCULAR: irr, irr. No murmurs, rubs, or gallops.  ABDOMEN: Soft, non-tender, non-distended. Bowel sounds present. No organomegaly or mass.  EXTREMITIES: No pedal edema, cyanosis, or clubbing.  PSYCHIATRIC: The patient is alert and oriented x name SKIN: No obvious rash, lesion, or ulcer.   DATA REVIEW:   CBC  Recent Labs Lab 06/07/16 1900  WBC 7.9  HGB 11.8*  HCT 36.7  PLT 110*    Chemistries   Recent Labs Lab 06/07/16 1810  NA 140  K 4.5  CL 101  CO2 29  GLUCOSE 102*  BUN 35*  CREATININE 1.34*  CALCIUM 10.3  AST 47*  ALT 15  ALKPHOS 63  BILITOT 1.5*    Cardiac Enzymes  Recent Labs Lab 06/07/16 1810 06/08/16 0002 06/08/16 0553  TROPONINI 0.06* 0.03* 0.04*    Microbiology Results  @[email or web address] @  RADIOLOGY:  Ct Head Wo Contrast  06/07/2016  CLINICAL DATA:  Code stroke EXAM: CT HEAD WITHOUT CONTRAST TECHNIQUE: Contiguous axial images were obtained from the base of the skull through the vertex without intravenous  contrast. COMPARISON:  05/01/2016 FINDINGS: No evidence of parenchymal hemorrhage or extra-axial fluid collection. No mass lesion, mass effect, or midline shift. No CT evidence of acute infarction. Subcortical white matter and periventricular small vessel ischemic changes. Intracranial atherosclerosis. Global cortical and central atrophy. Secondary mild ventricular prominence. The visualized paranasal sinuses are essentially clear. The mastoid air cells are unopacified. No evidence of calvarial fracture. IMPRESSION: No evidence of acute intracranial abnormality. Atrophy with small vessel ischemic changes. These results were called by telephone at the time of interpretation on 06/07/2016 at 6:01 pm to Dr. Marcelene Butte, who verbally acknowledged these results. Electronically Signed   By: Julian Hy M.D.   On: 06/07/2016 18:03   US Carotid Bilateral  06/08/2016  CLINICAL DATA:  82 year old female with a history of cerebral vascular accident EXAM: BILATERAL CAROTID DUPLEX ULTRASOUND TECHNIQUE: Pearline Cables scale imaging, color Doppler and duplex ultrasound were performed of bilateral  carotid and vertebral arteries in the neck. COMPARISON:  No prior duplex FINDINGS: Criteria: Quantification of carotid stenosis is based on velocity parameters that correlate the residual internal carotid diameter with NASCET-based stenosis levels, using the diameter of the distal internal carotid lumen as the denominator for stenosis measurement. The following velocity measurements were obtained: RIGHT ICA:  Systolic and 30 sig cm/sec, Diastolic 8 cm/sec CCA:  40 cm/sec SYSTOLIC ICA/CCA RATIO:  0.9 ECA:  42 cm/sec LEFT ICA:  Systolic 36 cm/sec, Diastolic 11 cm/sec CCA:  37 cm/sec SYSTOLIC ICA/CCA RATIO:  1.0 ECA:  39 cm/sec Right Brachial SBP: Not acquired Left Brachial SBP: Not acquired RIGHT CAROTID ARTERY: No significant calcifications of the right common carotid artery, with tortuosity. Intermediate waveform maintained. Heterogeneous and  calcified plaque at the right carotid bifurcation. Luminal shadowing present from the near field calcifications. Low resistance waveform of the right ICA, which demonstrates developing post stenotic waveform. RIGHT VERTEBRAL ARTERY: Antegrade flow with low resistance waveform. LEFT CAROTID ARTERY: No significant calcifications of the left common carotid artery with tortuosity. Intermediate waveform maintained. Heterogeneous and partially calcified plaque at the left carotid bifurcation in the with luminal shadowing. Low resistance waveform of the left ICA. LEFT VERTEBRAL ARTERY: Post obstructive waveform of the vertebral artery. IMPRESSION: Right: Color duplex indicates moderate heterogeneous and calcified plaque, with no hemodynamically significant stenosis by duplex criteria in the extracranial cerebrovascular circulation. Note that the flow velocities of the right ICA were obtained from an area distal to the maximum narrowing due to the presence of anterior wall plaque with shadowing and may be underestimating the percentage of ICA stenosis. If further evaluation is warranted, consider formal cerebral angiogram or alternatively, CTA. Left: Color duplex indicates moderate heterogeneous and calcified plaque, with no hemodynamically significant stenosis by duplex criteria in the extracranial cerebrovascular circulation. Left vertebral artery demonstrates waveform indicating proximal stenosis. Signed, Dulcy Fanny. Earleen Newport, DO [facility] Rad[facility] Electronically Signed   By: Corrie Mckusick D.O.   On: 06/08/2016 09:02      Management plans discussed with the patient's son and he is in agreement. Stable for discharge home   Patient should follow up with PCP in 1 week  CODE STATUS:     Code Status Orders        Start     Ordered   06/07/16 2011  Do not attempt resuscitation (DNR)   Continuous    Question Answer Comment  In the event of cardiac or respiratory  ARREST Do not call a "code blue"   In the event of cardiac or respiratory ARREST Do not perform Intubation, CPR, defibrillation or ACLS   In the event of cardiac or respiratory ARREST Use medication by any route, position, wound care, and other measures to relive pain and suffering. May use oxygen, suction and manual treatment of airway obstruction as needed for comfort.      06/07/16 2010    Code Status History    Date Active Date Inactive Code Status Order ID Comments User Context   06/07/2016  7:44 PM 06/07/2016  8:10 PM Full Code [id number]  Lytle Butte, MD ED    Advance Directive Documentation        Most Recent Value   Type of Advance Directive  Living will   Pre-existing out of facility DNR order (yellow form or pink MOST form)     "MOST" Form in Place?        TOTAL TIME TAKING CARE OF THIS  PATIENT: 35 minutes.    Note: This dictation was prepared with Dragon dictation along with smaller phrase technology. Any transcriptional errors that result from this process are unintentional.  Majesty Oehlert M.D on 06/08/2016 at 10:29 AM  Between 7am to 6pm - Pager - [phone] After 6pm go to [email or web address] - password [email or web address]  Office  [phone]  CC: Primary care physician; BABAOFF, Caryl Bis, MD

## 2016-06-08 NOTE — Evaluation (Signed)
Clinical/Bedside Swallow Evaluation Patient Details  Name: Marie Hicks MRN: [id number] Date of Birth: 1929/09/12  Today's Date: 06/08/2016 Time: SLP Start Time (ACUTE ONLY): 0945 SLP Stop Time (ACUTE ONLY): 1045 SLP Time Calculation (min) (ACUTE ONLY): 60 min  Past Medical History:  Past Medical History  Diagnosis Date  . Breast cancer ([place])   . Gout   . A-fib ([place])   . Arthritis   . Hypertension   . Shortness of breath dyspnea     with exertion  . Dementia     son and daughter are POA  . Anxiety   . HOH (hard of hearing)   . Vertigo     past hx   Past Surgical History:  Past Surgical History  Procedure Laterality Date  . Cholecystectomy    . Photocoagulation with laser Right 09/11/2015    Procedure: PHOTOCOAGULATION WITH LASER;  Surgeon: Ronnell Freshwater, MD;  Location: [facility];  Service: Ophthalmology;  Laterality: Right;  NEEDS REGULAR LASER PROBE NOT MICROPULSE  . Breast lumpectomy Left 2013    lumpectomy   HPI:  Pt is a 82 y.o. female with a known history of Baseline Dementia, chronic atrial fibrillation presenting with altered mental status. Patient unable to provide any meaningful information given mental status. History obtained from son present at bedside. He states she has been in her usual state of health however this morning she was much less responsive and would not speak. She also refused to walk. She has followed a few commands. Minimally verbal but gave her first name. She appeared bewildered and confused as to her surroundings.    Assessment / Plan / Recommendation Clinical Impression  This was a brief bedside assessment as pt was in between two tests out of her room. She seemed somewhat confused as to her surroundings and required verbal encouragement. Pt did accept few po boluses and appeared to tolerate the trials of ice chips, Nectar liquids and purees/softened solids w/ no overt s/s of aspiration noted; no decline in respiratory status  noted. Oral phase was grossly wfl w/ the po trials; she cleared adequately given time w/ the increased textured food. Pt required assistance w/ feeding but again was out of her room and away from family. She was mostly nonverbal but gave her name to SLP. She appears at min increased risk for aspiration in these circumstances at this time d/t declined Cognitive status and would benefit from an initial diet of Dysphagia 2 w/ Nectar liquids w/ general aspiration precautions; meds Whole in Puree for easier, safer swallowing. Recommend ST f/u for diet upgrade/trials next 1-2 days. NSG updated.     Aspiration Risk  Mild aspiration risk    Diet Recommendation  Dysphagia 2 w/ Nectar liquids w/ trials of foods/liquids to upgrade diet consistency as tolerates; general aspiration precautions; monitoring at meals d/t Cognitive decline; reduce distractions at meals.   Medication Administration: Whole meds with puree    Other  Recommendations Recommended Consults:  (Dietician f/u) Oral Care Recommendations: Oral care BID;Staff/trained caregiver to provide oral care Other Recommendations: Order thickener from pharmacy;Prohibited food (jello, ice cream, thin soups);Remove water pitcher   Follow up Recommendations  Skilled Nursing facility    Frequency and Duration min 3x week  2 weeks       Prognosis Prognosis for Safe Diet Advancement: Good Barriers to Reach Goals: Cognitive deficits      Swallow Study   General Date of Onset: 06/07/16 HPI: Pt is a 82 y.o. female with  a known history of Baseline Dementia, chronic atrial fibrillation presenting with altered mental status. Patient unable to provide any meaningful information given mental status. History obtained from son present at bedside. He states she has been in her usual state of health however this morning she was much less responsive and would not speak. She also refused to walk. She has followed a few commands. Minimally verbal but gave her first  name. She appeared bewildered and confused as to her surroundings.  Type of Study: Bedside Swallow Evaluation Previous Swallow Assessment: none indicated Diet Prior to this Study: Dysphagia 3 (soft);Thin liquids (per Dietician) Temperature Spikes Noted: No (wbc not elevated) Respiratory Status: Room air History of Recent Intubation: No Behavior/Cognition: Cooperative;Pleasant mood;Confused;Requires cueing;Distractible Oral Cavity Assessment: Dry Oral Care Completed by SLP: Recent completion by staff Oral Cavity - Dentition: Adequate natural dentition Vision:  (n/a) Self-Feeding Abilities: Needs assist;Needs set up;Total assist Patient Positioning: Upright in bed Baseline Vocal Quality: Normal (x2 words given) Volitional Cough: Cognitively unable to elicit Volitional Swallow: Unable to elicit    Oral/Motor/Sensory Function Overall Oral Motor/Sensory Function:  (appeared wfl during bolus management )   Ice Chips Ice chips: Within functional limits Presentation: Spoon (fed; 3 trials)   Thin Liquid Thin Liquid: Not tested Other Comments: not tested sec. to brevity of exam and pt b/t tests; baseline Dementia    Nectar Thick Nectar Thick Liquid: Within functional limits Presentation: Cup;Straw;Self Fed (assisted; ~3 ozs total)   Honey Thick Honey Thick Liquid: Not tested   Puree Puree: Within functional limits Presentation: Spoon (fed; 5 trials)   Solid   GO   Solid: Within functional limits (broken down boluses; moistened well) Presentation: Spoon (fed; 3 trials) Other Comments: level 2 texture       Orinda Kenner, MS, CCC-SLP  Watson,Katherine 06/08/2016,11:31 AM

## 2016-06-08 NOTE — Progress Notes (Addendum)
Initial Nutrition Assessment  DOCUMENTATION CODES:   Not applicable  INTERVENTION:   Cater to pt preferences on Dysphagia diet order with nectar thick liquids, SLP following Will recommend Mighty Shakes on meal trays and Magic Cup BID for added nutrition (each supplement provides approximately 300kcals and 9g protein)   NUTRITION DIAGNOSIS:   Inadequate oral intake related to inability to eat as evidenced by NPO status.  GOAL:   Patient will meet greater than or equal to 90% of their needs  MONITOR:   Diet advancement, Labs, Weight trends, I & O's  REASON FOR ASSESSMENT:   Malnutrition Screening Tool    ASSESSMENT:   Pt admitted with AMS secondary to CVA. Pt with h/o baseline dementia per MD note.  Past Medical History  Diagnosis Date  . Breast cancer ([place])   . Gout   . A-fib ([place])   . Arthritis   . Hypertension   . Shortness of breath dyspnea     with exertion  . Dementia     son and daughter are POA  . Anxiety   . HOH (hard of hearing)   . Vertigo     past hx    Diet Order:  DIET DYS 2 Room service appropriate?: No; Fluid consistency:: Nectar Thick  SLP evaluation pending. Pt out of room for testing on visit. NPO this am.  Pt family in room, reports pt appetite has not been that good however relatively stable recently, usually eating 2 good meals per day and snack between. Pt with dementia, pt family reports her facility usually cuts up her food but she was taking thin liquids and eating regular consistency foods. Family reports at times pt pockets food and needs reminding to swallow. Family also reports she has access to protein shakes if she wants one as well as bananas, oranges and fruits for snack. Family did mention pt has to be woken often times to eat.   Medications: Allopurinol, MVI, Calcium citrate with vitamin D, NS at 71mL/hr Labs: BUN 35, Cre 1.34   Gastrointestinal Profile: Last BM:  no BM documented   Nutrition-Focused Physical Exam  Findings:  Unable to complete Nutrition-Focused physical exam at this time as pt out of room   Weight Change: Pt family reports at one time pt was 250lbs but that has been a long time ago. Pt family reports pt likely has blost about 10-15lbs in the past year and report currently she is 170lbs. Per [facility] weight trends 19% weight loss in one year.   Skin:  Reviewed, no issues   Height:   Ht Readings from Last 1 Encounters:  06/07/16 5\' 4"  (1.626 m)    Weight:   Wt Readings from Last 1 Encounters:  06/07/16 170 lb 3.2 oz (77.202 kg)   Wt Readings from Last 10 Encounters:  06/07/16 170 lb 3.2 oz (77.202 kg)  05/01/16 218 lb (98.884 kg)  02/10/16 218 lb (98.884 kg)  10/02/15 180 lb (81.647 kg)  09/20/15 197 lb 15.6 oz (89.8 kg)  09/11/15 200 lb (90.719 kg)  06/22/15 209 lb 5.2 oz (94.95 kg)    BMI:  Body mass index is 29.2 kg/(m^2).  Estimated Nutritional Needs:   Kcal:  1725-2050kcals  Protein:  85-100g protein  Fluid:  >/= 1.8L fluid  EDUCATION NEEDS:   Education needs no appropriate at this time  Dwyane Luo, RD, LDN Pager [phone] Weekend/On-Call Pager [phone]

## 2016-06-08 NOTE — Clinical Social Work Note (Signed)
CSW informed that patient is from memory care at [facility] so CSW contacted Kennyth Lose at [facility] and let her know that I was revising the FL2 to reflect this and would re-send it to her via epic fax. CSW contacted the MD to re-sign the FL2 that was revised to reflect memory care. Shela Leff MSW,LCSW [phone]

## 2016-06-08 NOTE — Plan of Care (Signed)
Problem: Education: Goal: Knowledge of disease or condition will improve Outcome: Progressing Pt unable to follow commands. Discuss treatment with family

## 2016-06-08 NOTE — Progress Notes (Signed)
Pt being discharged back to [facility] memory care, report called to [place] at facility, pt awake, family at bedside, pt with no noted complaints, no distress or discomfort noted

## 2016-06-08 NOTE — Clinical Social Work Note (Signed)
Clinical Social Work Assessment  Patient Details  Name: Marie Hicks MRN: [id number] Date of Birth: 08-14-1929  Date of referral:  06/08/16               Reason for consult:  Facility Placement                Permission sought to share information with:    Permission granted to share information::     Name::        Agency::     Relationship::     Contact Information:     Housing/Transportation Living arrangements for the past 2 months:  [place] of Information:  Adult Children Patient Interpreter Needed:  None Criminal Activity/Legal Involvement Pertinent to Current Situation/Hospitalization:  No - Comment as needed Significant Relationships:  Adult Children Lives with:  Facility Resident Do you feel safe going back to the place where you live?  Yes Need for family participation in patient care:  Yes (Comment)  Care giving concerns:  Patient resides at [facility]   Social Worker assessment / plan:  Patient admitted in the early morning hours to the medical floor and is being discharged today to return to [facility] today. CSW contacted [facility] and spoke to [place] who was the weekend med tech and who was the only one available to discuss whether or not patient could return. Kennyth Lose gave approval for patient to return. Discharge information sent via epic and CSW informed Kennyth Lose that discharge info would come via Epic. Patient was off the floor when CSW came to see if family was in the room. Patient's daughter: Butch Penny was in patient's room and verbalized agreement with discharge and return to [facility]. Butch Penny stated she would transport her today back to [facility]. MD stated that there were no new medications at discharge. FL2 completed and sent via epic as well.  Employment status:  Retired Nurse, adult PT Recommendations:  Not assessed at this time Information / Referral to community resources:     Patient/Family's  Response to care:  Patient's daughter expressed appreciation for CSW assistance.  Patient/Family's Understanding of and Emotional Response to Diagnosis, Current Treatment, and Prognosis:  Patient's daughter pleased that patient was able to return to [facility] today.  Emotional Assessment Appearance:   (off floor for test) Attitude/Demeanor/Rapport:  Unable to Assess Affect (typically observed):  Unable to Assess Orientation:   (baseline dementia; off floor for test at time of assessment) Alcohol / Substance use:  Not Applicable Psych involvement (Current and /or in the community):  No (Comment)  Discharge Needs  Concerns to be addressed:  No discharge needs identified Readmission within the last 30 days:  No Current discharge risk:  None Barriers to Discharge:  No Barriers Identified   Shela Leff, LCSW 06/08/2016, 1:14 PM

## 2016-06-08 NOTE — NC FL2 (Addendum)
[place] LEVEL OF CARE SCREENING TOOL     IDENTIFICATION  Patient Name: Marie Hicks Birthdate: September 29, 1929 Sex: female Admission Date (Current Location): 06/07/2016  [place] and [place] Number:  Engineering geologist and Address:  [facility], [address] [place], [place] [address]      Provider Number: [id number]  Attending Physician Name and Address:  Bettey Costa, MD  Relative Name and Phone Number:       Current Level of Care: Hospital Recommended Level of Care: Assisted Living Facility/Memory Care Prior Approval Number:    Date Approved/Denied:   PASRR Number:    Discharge Plan:  (ALF) Memory Care    Current Diagnoses: Dementia R/O CVA Breast CA Benign essential HTN 03/08/2014   . LBP (low back pain) 03/08/2014  . HLD (hyperlipidemia) 03/08/2014  . Diabetes mellitus ([place])           Orientation RESPIRATION BLADDER Height & Weight     Self  Normal Incontinent Weight: 170 lb 3.2 oz (77.202 kg) Height:  5\' 4"  (162.6 cm)  BEHAVIORAL SYMPTOMS/MOOD NEUROLOGICAL BOWEL NUTRITION STATUS   (none)  (none) Incontinent Diet (dys 2)  AMBULATORY STATUS COMMUNICATION OF NEEDS Skin   Limited Assist Verbally Normal                       Personal Care Assistance Level of Assistance  Bathing, Dressing Bathing Assistance: Limited assistance   Dressing Assistance: Limited assistance     Functional Limitations Info             SPECIAL CARE FACTORS FREQUENCY                       Contractures Contractures Info: Not present    Additional Factors Info  Code Status Code Status Info: DNR             DISCHARGE MEDICATIONS:   Current Discharge Medication List    CONTINUE these medications which have NOT CHANGED   Details  divalproex (DEPAKOTE ER) 250 MG 24 hr tablet Take 250 mg by mouth daily.    ferrous sulfate 325 (65 FE) MG tablet Take 325 mg by mouth daily with breakfast.     allopurinol (ZYLOPRIM) 300 MG tablet Take 300 mg by mouth daily.    bimatoprost (LUMIGAN) 0.01 % SOLN Place 1 drop into the right eye at bedtime.    Brinzolamide-Brimonidine (SIMBRINZA) 1-0.2 % SUSP Place 1 drop into the right eye 3 (three) times daily.    Calcium-Vitamin D-Vitamin K 500-100-40 MG-UNT-MCG CHEW Chew 1 capsule by mouth 2 (two) times daily.    citalopram (CELEXA) 10 MG tablet Take 10 mg by mouth daily.    hydroxypropyl methylcellulose / hypromellose (ISOPTO TEARS / GONIOVISC) 2.5 % ophthalmic solution Place 1 drop into both eyes 4 (four) times daily.    letrozole (FEMARA) 2.5 MG tablet Take 1 tablet (2.5 mg total) by mouth daily. Qty: 30 tablet, Refills: 6   Associated Diagnoses: Malignant neoplasm of left female breast, unspecified site of breast (HCC)    lisinopril (PRINIVIL,ZESTRIL) 10 MG tablet Take 10 mg by mouth daily.     Multiple Vitamin (MULTI-VITAMINS) TABS Take 1 tablet by mouth daily.     nystatin (MYCOSTATIN) powder Apply 1 Dose topically daily as needed.    risperiDONE (RISPERDAL) 1 MG tablet Take 1 mg by mouth at bedtime.    traZODone (DESYREL) 100 MG tablet Take 100 mg by mouth  at bedtime.    triamcinolone cream (KENALOG) 0.1 % Apply 1 application topically 2 (two) times daily as needed (for rash/irritation on stomach folds).             Discharge Medications: Please see discharge summary for a list of discharge medications.  Relevant Imaging Results:  Relevant Lab Results:   Additional Information    Shela Leff, LCSW

## 2016-08-15 IMAGING — CT CT HEAD W/O CM
3 of 7 series · 14 of 47 positions shown, 16 images · non-contrast
Comparison: 05/01/2016

CLINICAL DATA: Code stroke

EXAM:
CT HEAD WITHOUT CONTRAST
TECHNIQUE: Contiguous axial images were obtained from the base of the skull
through the vertex without intravenous contrast.

[Series 2: head wo · axial · 0.43mm/px · z∈[-253,-128]mm · 8 of 31 slices shown, 10 images]
[im 3/31  brain]
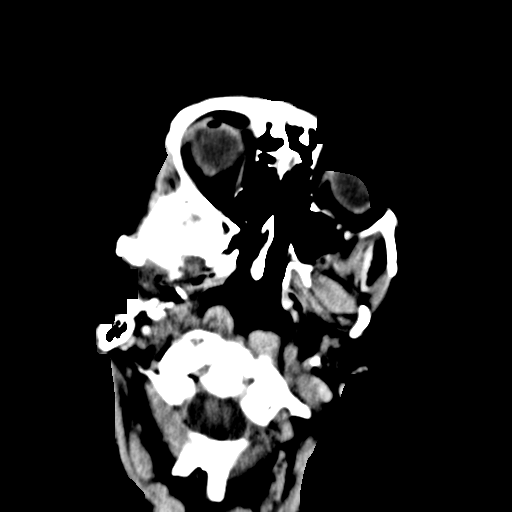
[im 3/31  bone]
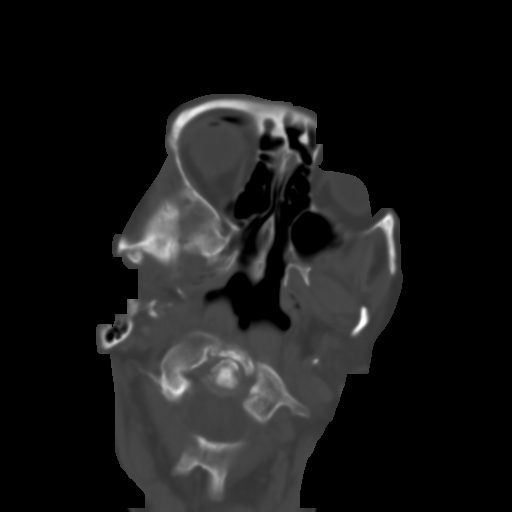
[im 8/31  brain]
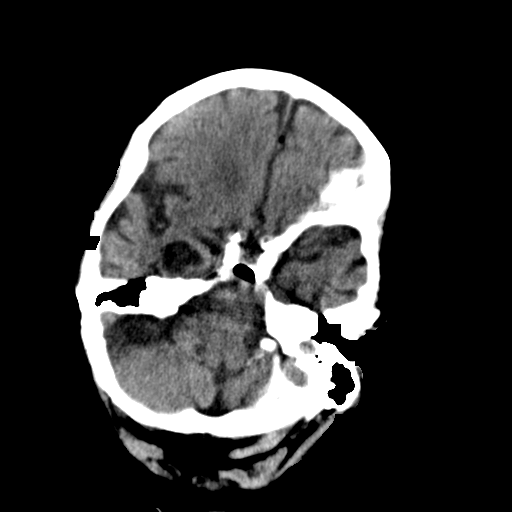
[im 11/31  brain]
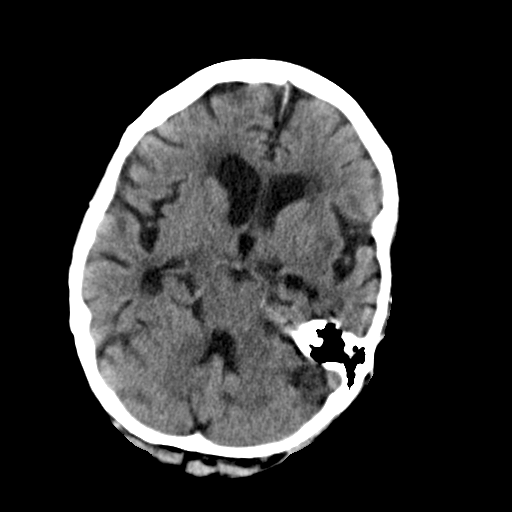
[im 13/31  brain]
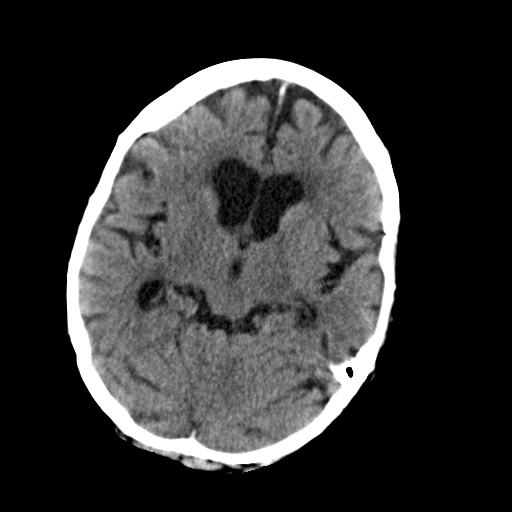
[im 18/31  brain]
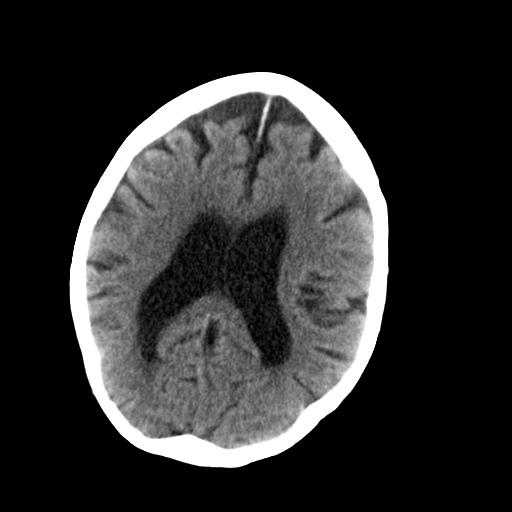
[im 18/31  bone]
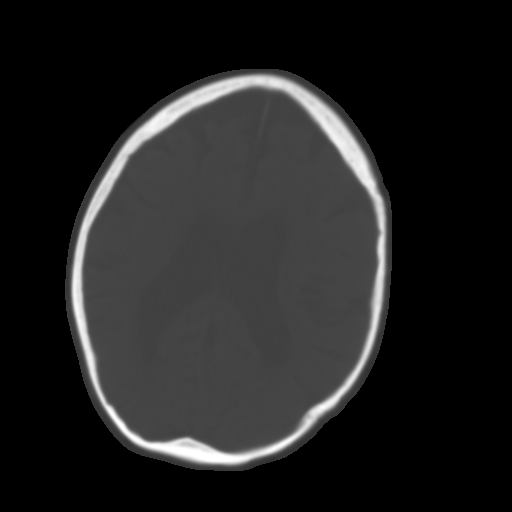
[im 21/31  brain]
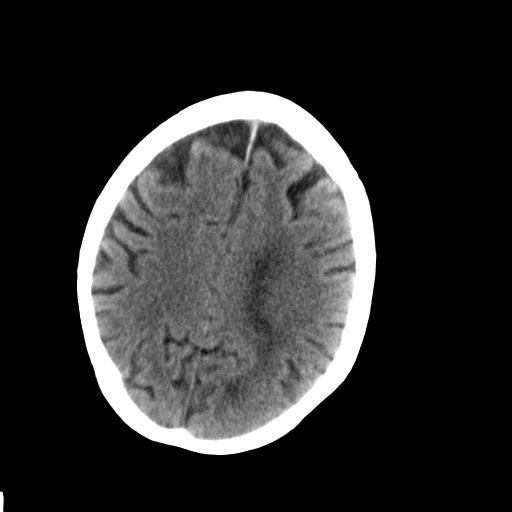
[im 23/31  brain]
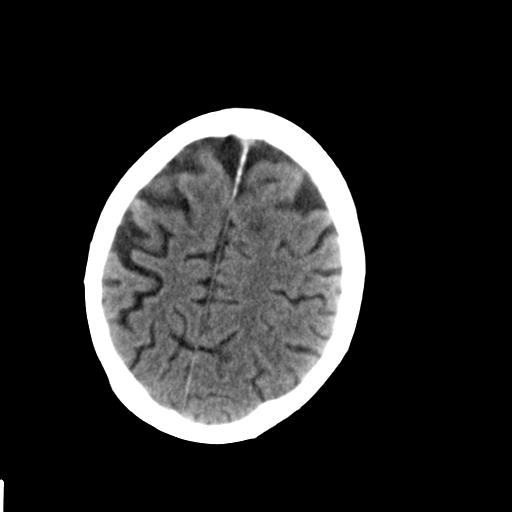
[im 28/31  brain]
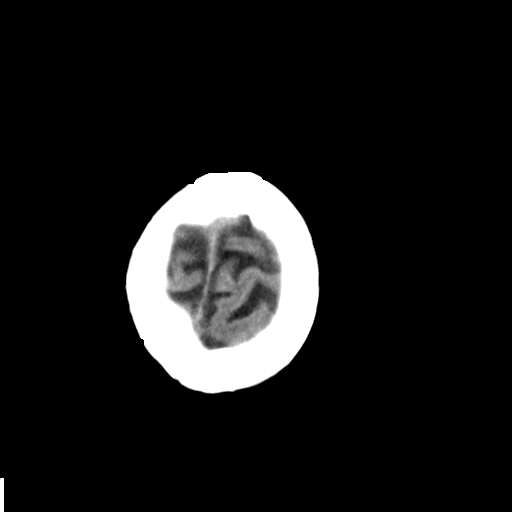

[Series 6: coronal soft · coronal · 0.30mm/px · 3 of 64 slices shown]
[im 16/64  brain]
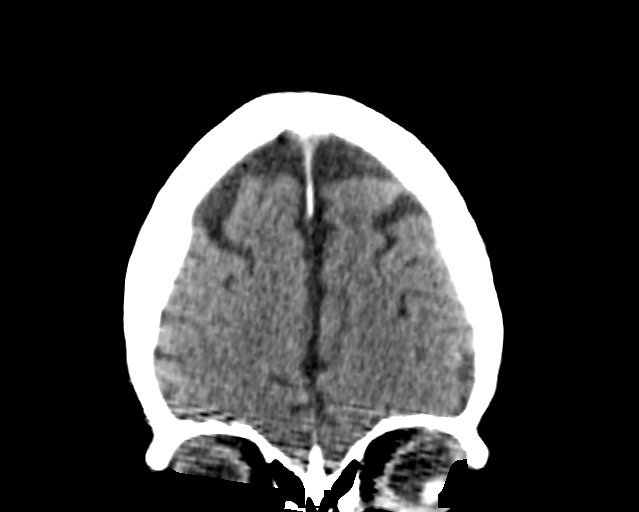
[im 32/64  brain]
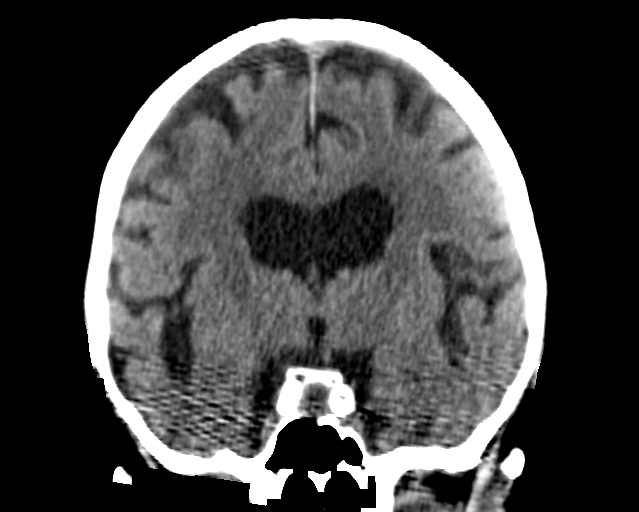
[im 48/64  brain]
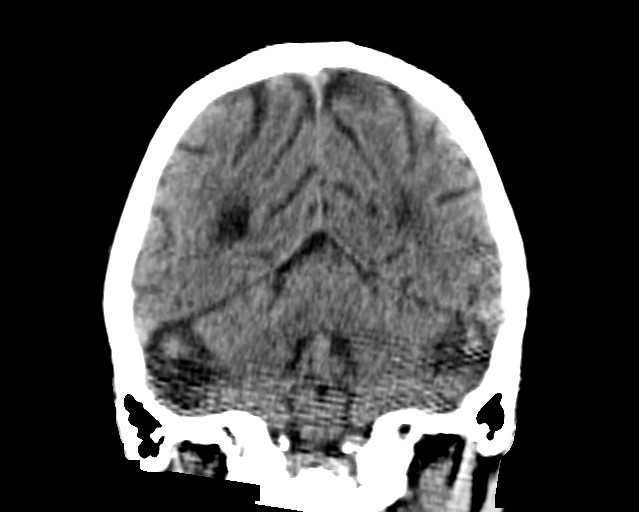

[Series 9: sagittal soft · sagittal · 0.28mm/px · 3 of 41 slices shown]
[im 8/41  brain]
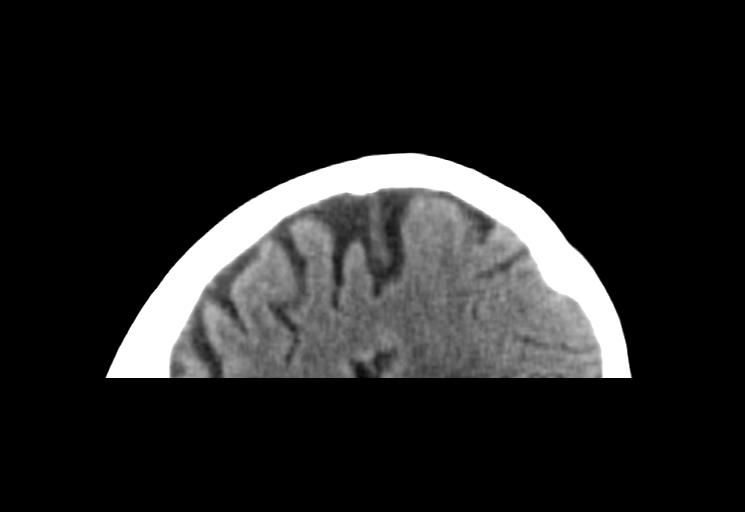
[im 15/41  brain]
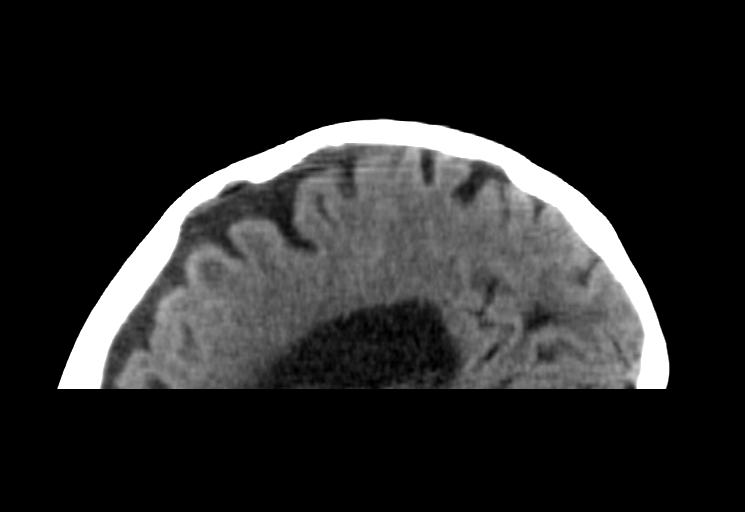
[im 22/41  brain]
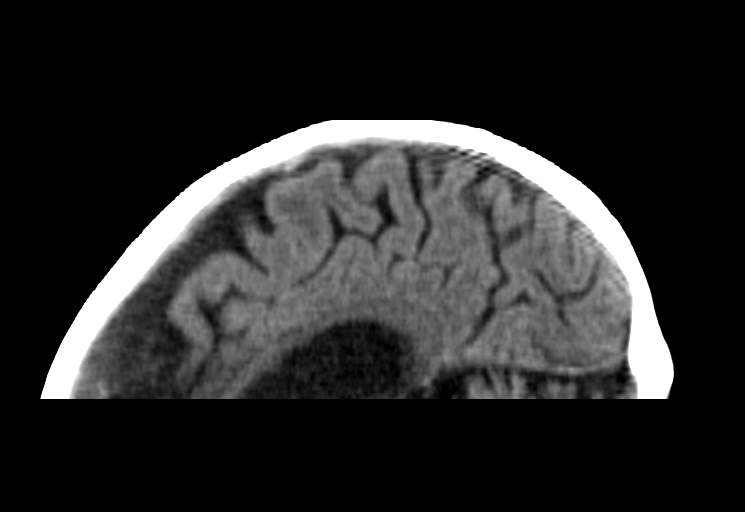

[14 of 47 positions shown; findings below may reference images not displayed]

FINDINGS: No evidence of parenchymal hemorrhage or extra-axial fluid
collection. No mass lesion, mass effect, or midline shift.

No CT evidence of acute infarction.

Subcortical white matter and periventricular small vessel ischemic
changes. Intracranial atherosclerosis.

Global cortical and central atrophy. Secondary mild ventricular
prominence.

The visualized paranasal sinuses are essentially clear. The mastoid
air cells are unopacified.

No evidence of calvarial fracture.
IMPRESSION: No evidence of acute intracranial abnormality.

Atrophy with small vessel ischemic changes.

These results were called by telephone at the time of interpretation
on 06/07/2016 at [DATE] to Dr. Opher, who verbally acknowledged
these results.

## 2016-10-02 DEATH — deceased

## 2017-04-09 ENCOUNTER — Ambulatory Visit: Payer: Medicare Other

## 2017-04-09 ENCOUNTER — Other Ambulatory Visit: Payer: Medicare Other

## 2017-04-16 ENCOUNTER — Ambulatory Visit: Payer: Medicare Other | Admitting: Internal Medicine
